# Patient Record
Sex: Male | Born: 1950 | Race: Black or African American | Hispanic: No | Marital: Married | Smoking: Current some day smoker
Health system: Southern US, Community
[De-identification: ages and names within clinical notes are randomized; demographics above are authoritative.]

## PROBLEM LIST (undated history)

## (undated) DIAGNOSIS — T7840XA Allergy, unspecified, initial encounter: Secondary | ICD-10-CM

## (undated) DIAGNOSIS — B009 Herpesviral infection, unspecified: Secondary | ICD-10-CM

## (undated) DIAGNOSIS — I10 Essential (primary) hypertension: Secondary | ICD-10-CM

## (undated) DIAGNOSIS — E785 Hyperlipidemia, unspecified: Secondary | ICD-10-CM

## (undated) HISTORY — DX: Hyperlipidemia, unspecified: E78.5

## (undated) HISTORY — DX: Allergy, unspecified, initial encounter: T78.40XA

## (undated) HISTORY — DX: Essential (primary) hypertension: I10

## (undated) HISTORY — PX: APPENDECTOMY: SHX54

## (undated) HISTORY — DX: Herpesviral infection, unspecified: B00.9

---

## 2001-12-18 ENCOUNTER — Ambulatory Visit (HOSPITAL_COMMUNITY): Admission: RE | Admit: 2001-12-18 | Discharge: 2001-12-18 | Payer: Self-pay | Admitting: Cardiology

## 2001-12-18 ENCOUNTER — Encounter: Payer: Self-pay | Admitting: Cardiology

## 2005-12-28 ENCOUNTER — Emergency Department (HOSPITAL_COMMUNITY): Admission: EM | Admit: 2005-12-28 | Discharge: 2005-12-28 | Payer: Self-pay | Admitting: Emergency Medicine

## 2006-01-04 ENCOUNTER — Ambulatory Visit: Payer: Self-pay | Admitting: Internal Medicine

## 2006-01-04 ENCOUNTER — Inpatient Hospital Stay (HOSPITAL_COMMUNITY): Admission: EM | Admit: 2006-01-04 | Discharge: 2006-01-14 | Payer: Self-pay | Admitting: Emergency Medicine

## 2006-01-05 ENCOUNTER — Encounter (INDEPENDENT_AMBULATORY_CARE_PROVIDER_SITE_OTHER): Payer: Self-pay | Admitting: Specialist

## 2006-01-09 ENCOUNTER — Encounter (INDEPENDENT_AMBULATORY_CARE_PROVIDER_SITE_OTHER): Payer: Self-pay | Admitting: *Deleted

## 2006-01-09 ENCOUNTER — Encounter (INDEPENDENT_AMBULATORY_CARE_PROVIDER_SITE_OTHER): Payer: Self-pay | Admitting: Specialist

## 2006-01-30 ENCOUNTER — Encounter
Admission: RE | Admit: 2006-01-30 | Discharge: 2006-01-30 | Payer: Self-pay | Admitting: Thoracic Surgery (Cardiothoracic Vascular Surgery)

## 2006-02-04 ENCOUNTER — Ambulatory Visit: Payer: Self-pay | Admitting: Hospitalist

## 2006-02-04 ENCOUNTER — Encounter (INDEPENDENT_AMBULATORY_CARE_PROVIDER_SITE_OTHER): Payer: Self-pay | Admitting: Internal Medicine

## 2006-02-04 DIAGNOSIS — N189 Chronic kidney disease, unspecified: Secondary | ICD-10-CM

## 2006-02-04 DIAGNOSIS — Z8709 Personal history of other diseases of the respiratory system: Secondary | ICD-10-CM | POA: Insufficient documentation

## 2006-02-04 DIAGNOSIS — I498 Other specified cardiac arrhythmias: Secondary | ICD-10-CM

## 2006-02-04 DIAGNOSIS — F528 Other sexual dysfunction not due to a substance or known physiological condition: Secondary | ICD-10-CM

## 2006-02-04 DIAGNOSIS — F17200 Nicotine dependence, unspecified, uncomplicated: Secondary | ICD-10-CM

## 2006-02-04 DIAGNOSIS — I10 Essential (primary) hypertension: Secondary | ICD-10-CM | POA: Insufficient documentation

## 2006-02-04 DIAGNOSIS — J984 Other disorders of lung: Secondary | ICD-10-CM

## 2006-02-04 DIAGNOSIS — D649 Anemia, unspecified: Secondary | ICD-10-CM

## 2006-02-04 DIAGNOSIS — E1169 Type 2 diabetes mellitus with other specified complication: Secondary | ICD-10-CM

## 2006-02-04 DIAGNOSIS — E785 Hyperlipidemia, unspecified: Secondary | ICD-10-CM

## 2006-02-04 DIAGNOSIS — J869 Pyothorax without fistula: Secondary | ICD-10-CM | POA: Insufficient documentation

## 2006-02-04 LAB — CONVERTED CEMR LAB
BUN: 12 mg/dL (ref 6–23)
CO2: 29 meq/L (ref 19–32)
Calcium: 9.5 mg/dL (ref 8.4–10.5)
Glucose, Bld: 98 mg/dL (ref 70–99)
Platelets: 327 10*3/uL (ref 150–400)
RBC: 4.29 M/uL (ref 4.22–5.81)
WBC: 7.1 10*3/uL (ref 4.0–10.5)

## 2006-02-12 ENCOUNTER — Ambulatory Visit: Payer: Self-pay | Admitting: Internal Medicine

## 2008-03-24 DIAGNOSIS — K921 Melena: Secondary | ICD-10-CM

## 2008-03-25 ENCOUNTER — Ambulatory Visit: Payer: Self-pay | Admitting: Gastroenterology

## 2008-07-08 IMAGING — CR DG CHEST 2V
2 series · 2 of 2 positions shown · non-contrast
Comparison: 01/05/06.

CLINICAL DATA: Lung mass.  Assess for improvement of left pleural effusion. 
 CHEST ? 2 VIEW:

[view not recorded (1 of 2)]
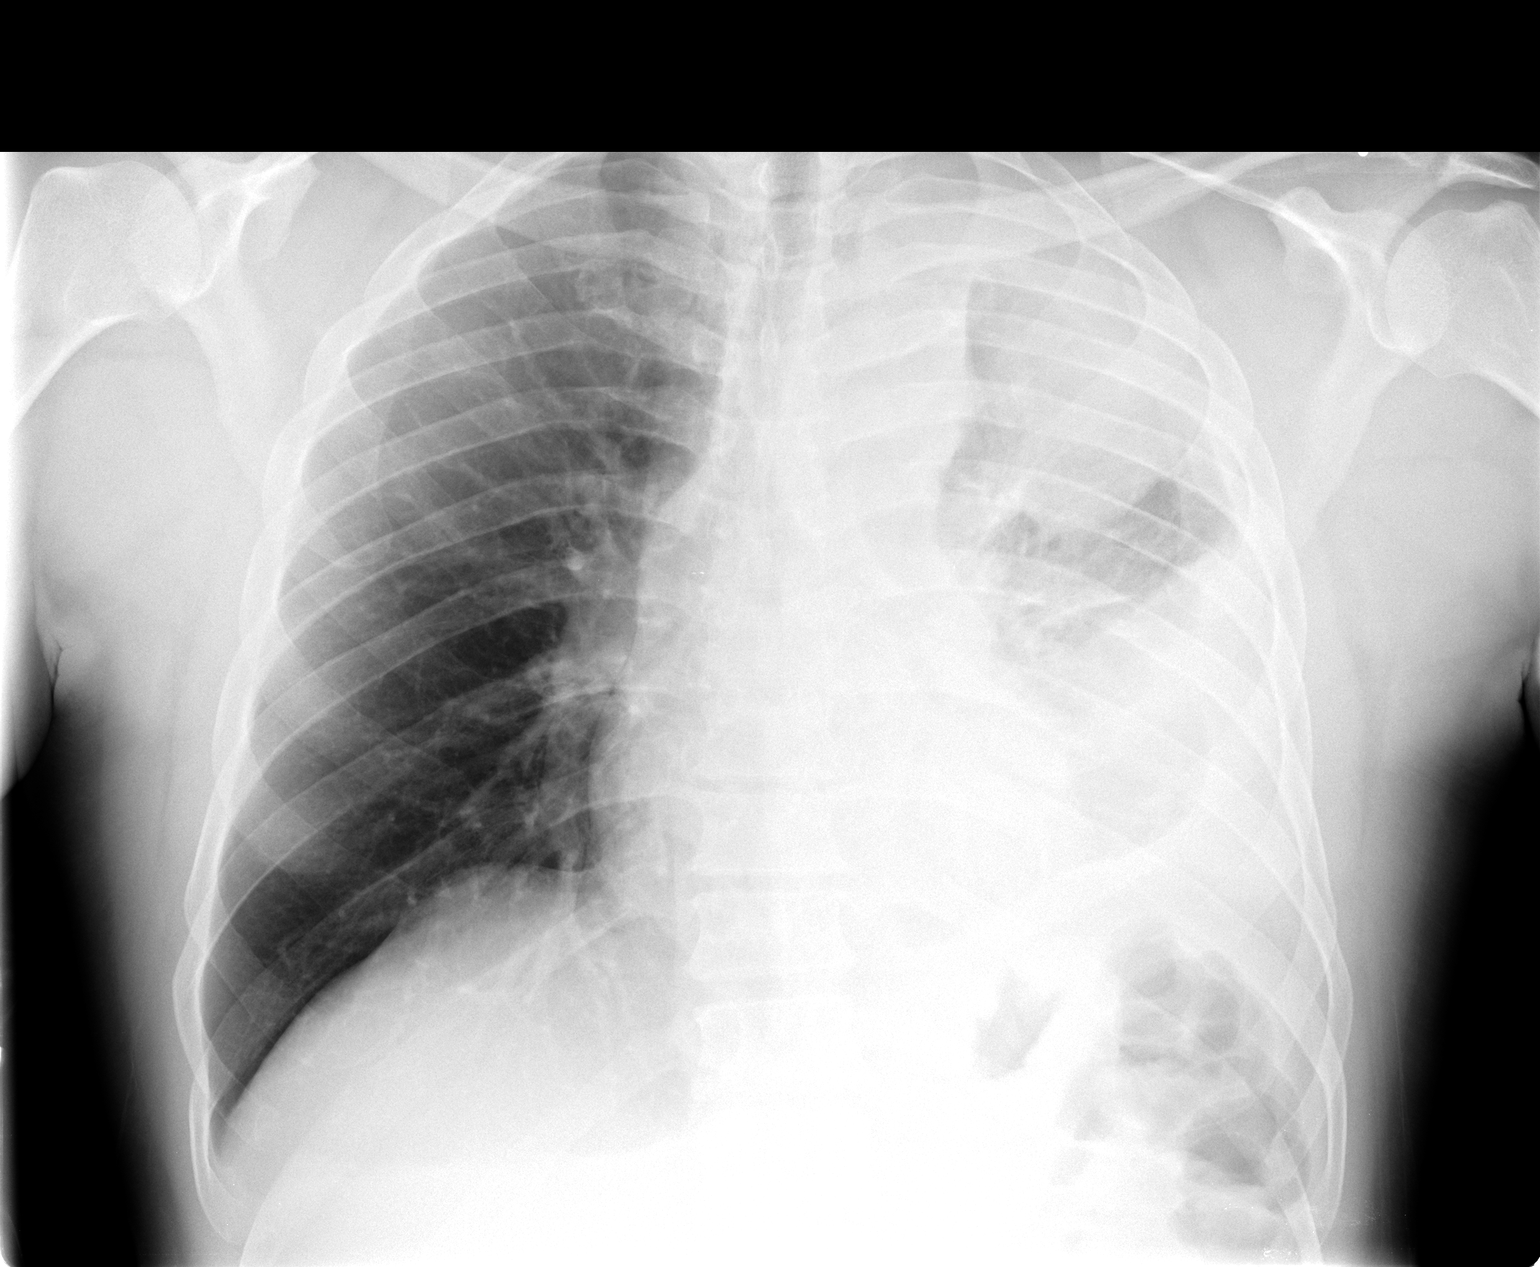

[view not recorded (2 of 2)]
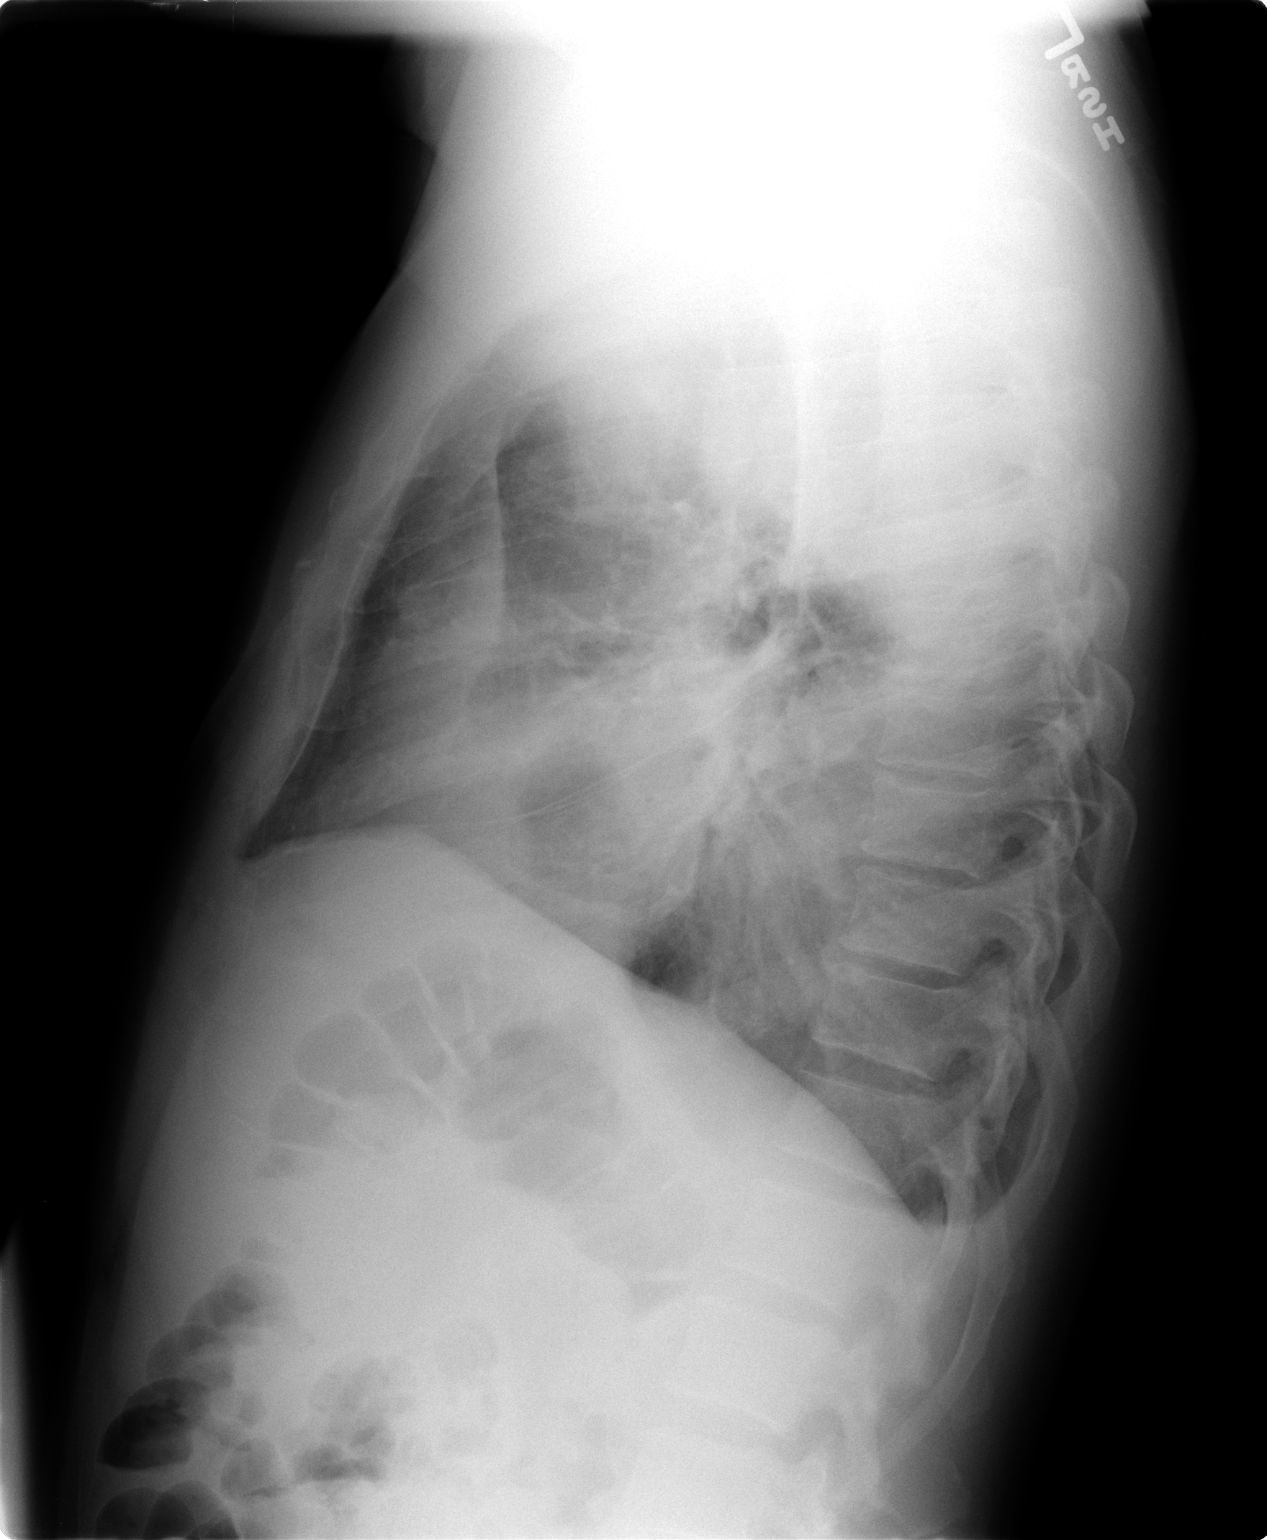

[2 of 2 positions shown; findings below may reference images not displayed]

FINDINGS: Large loculated left pleural effusion has not changed in the interval.  Right lung clear.
IMPRESSION: 1.  No change in large loculated left pleural effusion, worrisome for empyema.  Underlying mass not excluded. 
 2.  Question developing small right pleural effusion.

## 2008-07-09 IMAGING — CR DG CHEST 2V
2 series · 2 of 2 positions shown · non-contrast
Comparison: 01/07/06 radiographs and 01/04/06 chest CT.

CLINICAL DATA: Preop respiratory exam.  Left lung mass ? pneumonia. 
CHEST ? 2 VIEW:

[w chest pa]
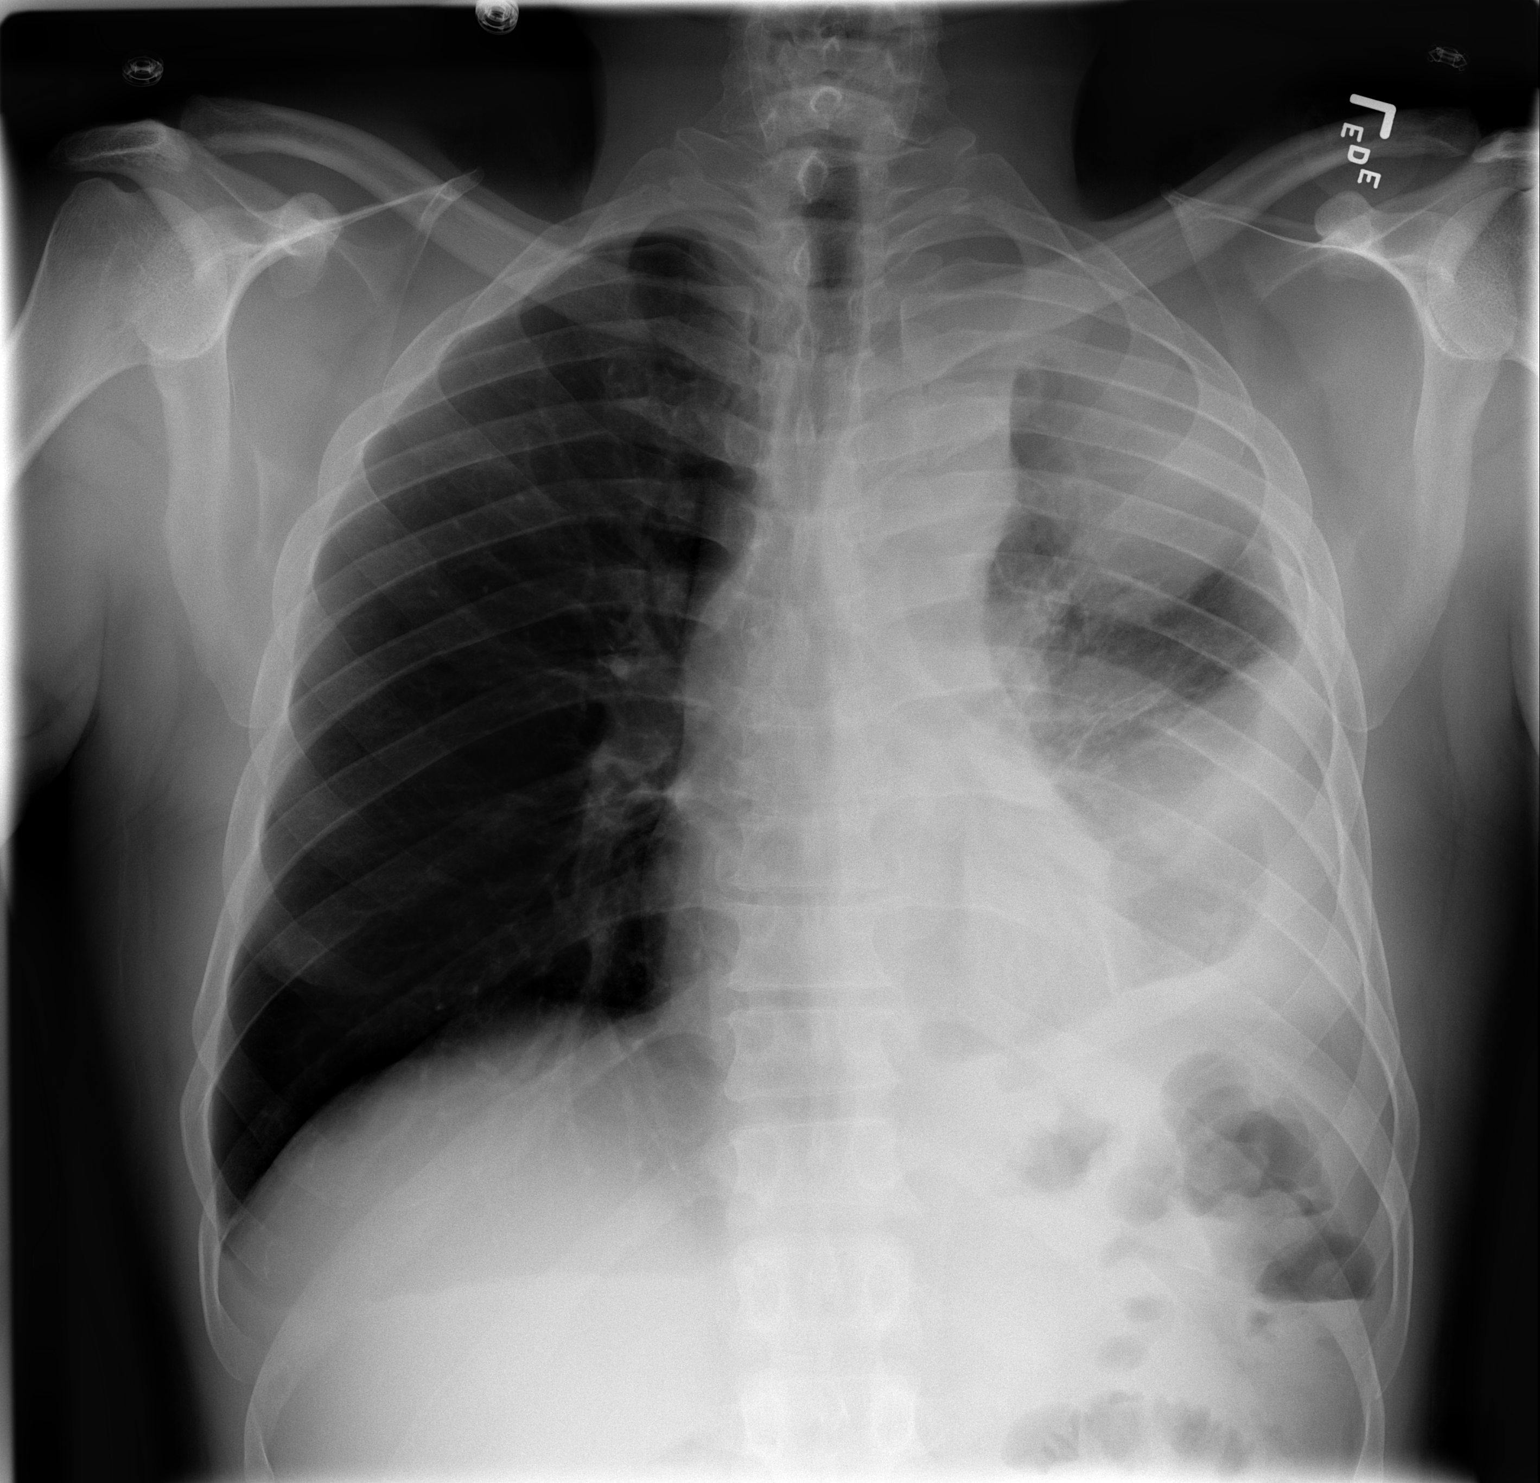

[w chest lat]
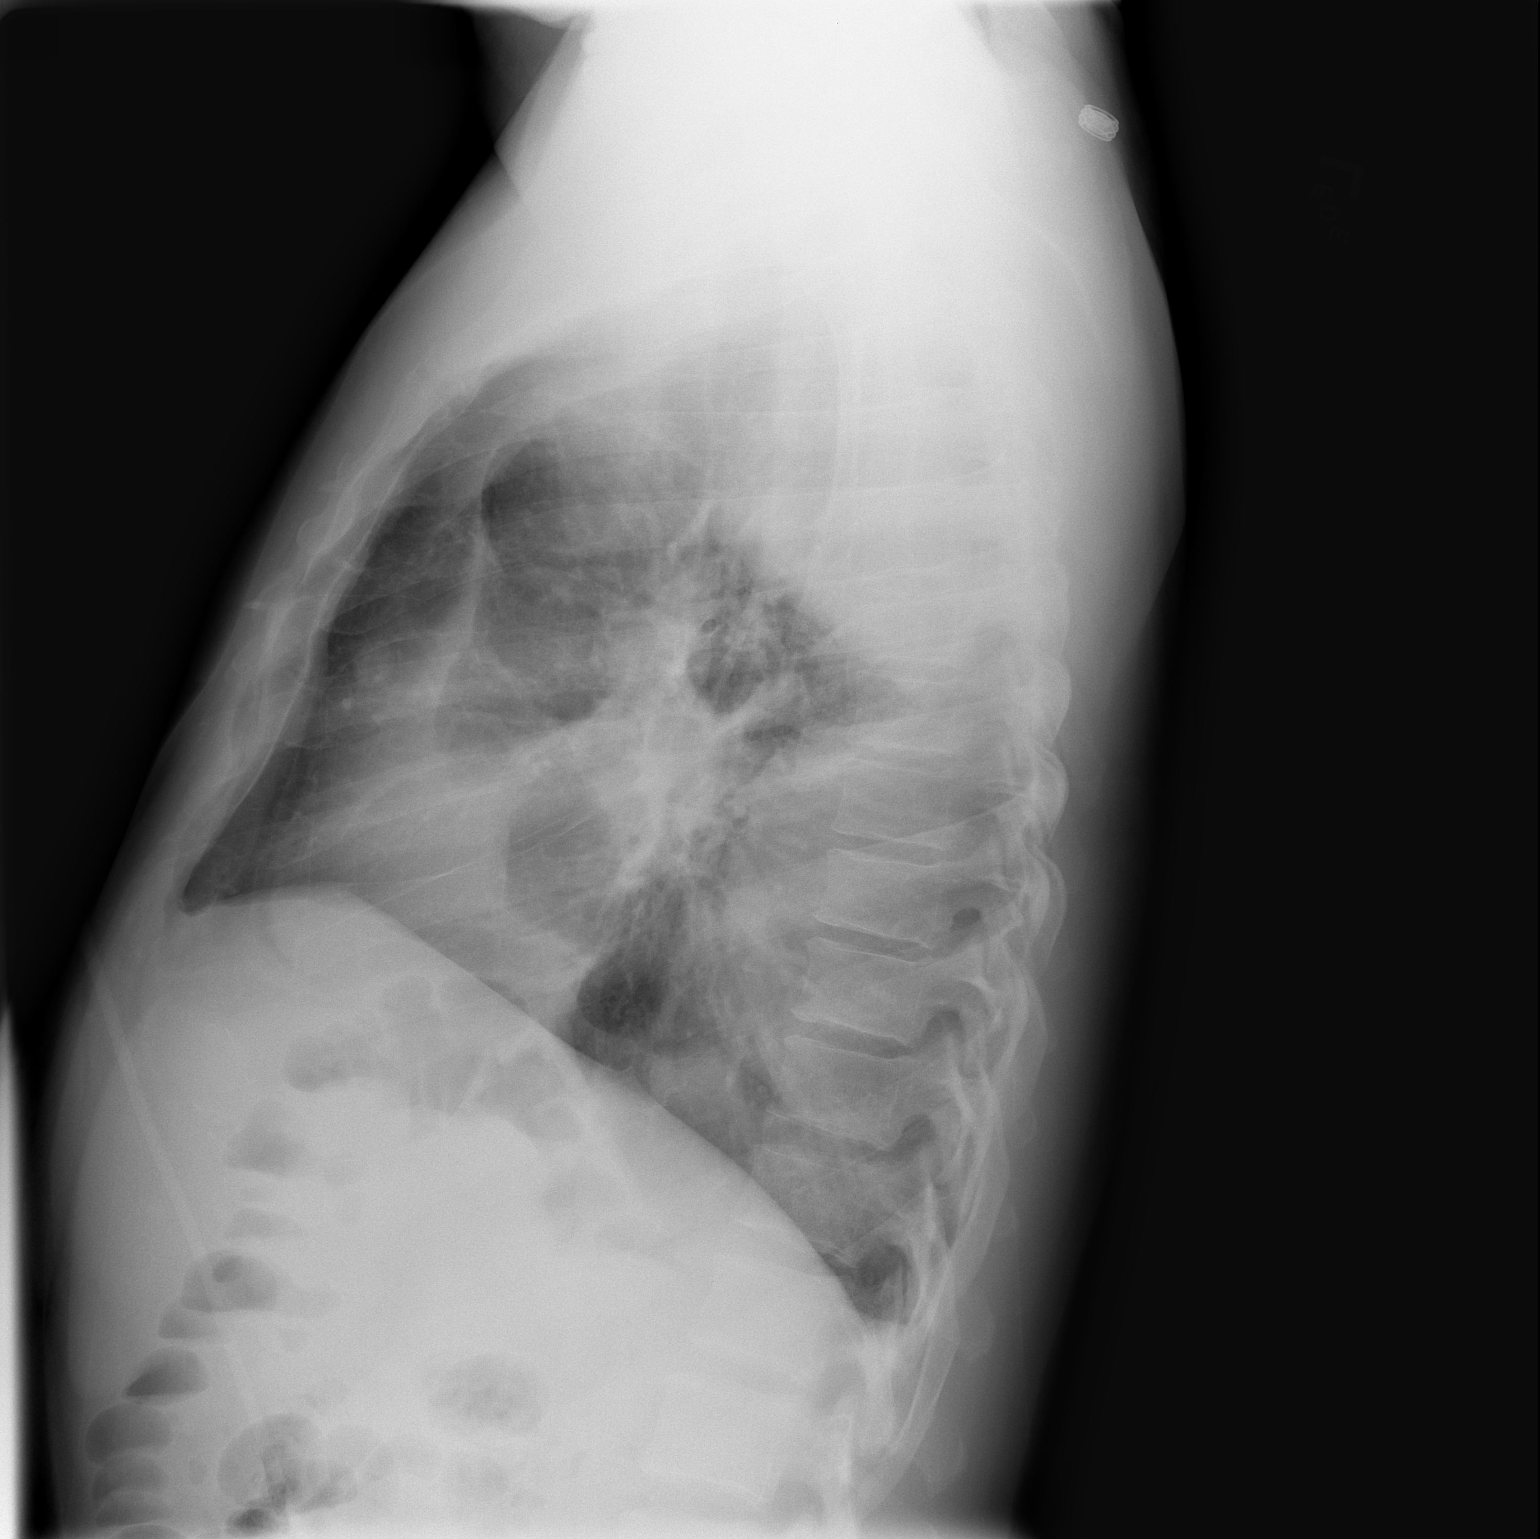

[2 of 2 positions shown; findings below may reference images not displayed]

FINDINGS: The loculated left pleural effusion and associated volume loss in the left hemithorax do not appear significantly changed.  The left lung aeration does appear mildly improved.  The right lung remains clear, and there is no right-sided pleural effusion.
IMPRESSION: Slight interval improvement in left lung aeration.  The loculated left pleural effusion does not appear significantly changed with a large superior component.

## 2008-07-10 IMAGING — CR DG CHEST 1V PORT
1 series · 1 of 1 positions shown · non-contrast
Comparison: 01/08/06.

CLINICAL DATA: 55 year-old male, status post left thoracotomy and chest tube insertions.
PORTABLE CHEST - 1 VIEW:

[view not recorded]
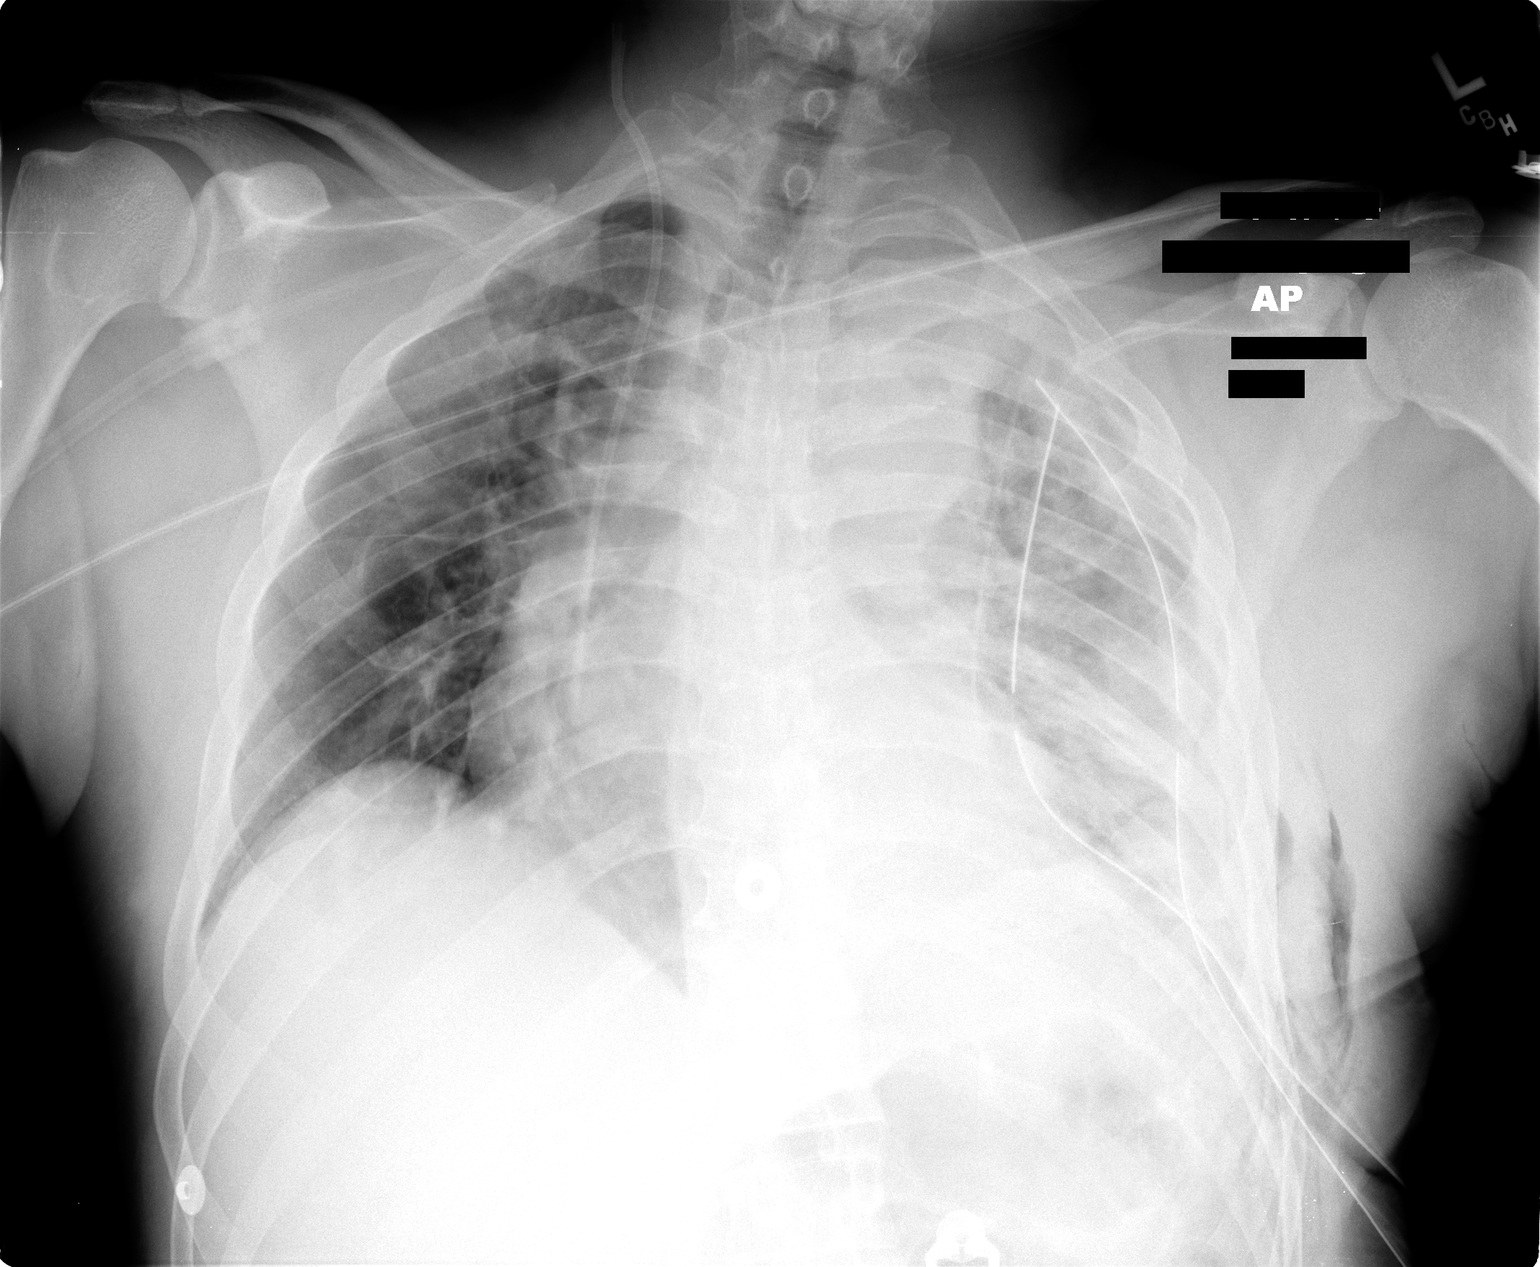

[1 of 1 positions shown; findings below may reference images not displayed]

FINDINGS: Two left chest tubes are noted. Subcutaneous air is present along the left chest wall.  Right IJ central line tip is in the proximal right atrium. There is residual loculated left pleural fluid particularly in the left lung apex.  Diffuse left lung airspace disease and atelectasis persists.  Right lung demonstrates lower volume with increased atelectasis.
IMPRESSION: 1.  Status post left thoracotomy and 2 chest tubes.
2.  Improvement in loculated left effusion with residual left pleural fluid and left lung airspace disease and atelectasis.
3.  Increased right lung diffuse atelectasis with lower lung volumes.

## 2009-01-08 HISTORY — PX: LUNG SURGERY: SHX703

## 2010-05-05 ENCOUNTER — Other Ambulatory Visit: Payer: Self-pay | Admitting: Internal Medicine

## 2010-05-05 DIAGNOSIS — R7989 Other specified abnormal findings of blood chemistry: Secondary | ICD-10-CM

## 2010-05-26 NOTE — Consult Note (Signed)
NAMEMURAD, STAPLES               ACCOUNT NO.:  [id number]   MEDICAL RECORD NO.:  [id number]          PATIENT TYPE:  INP   LOCATION:  5020                         FACILITY:  [facility]   PHYSICIAN:  Salvatore Decent. Dorris Fetch, M.D.DATE OF BIRTH:  [birth year]   DATE OF CONSULTATION:  01/07/2006  DATE OF DISCHARGE:                                 CONSULTATION   DATE OF CONSULTATION:  January 07, 2006.   REASON FOR CONSULTATION:  Left parapneumonic effusion.   HISTORY OF PRESENT ILLNESS:  Mr. Ostrow is a 60 year old gentleman with  history of tobacco abuse and hypertension who initially became ill in  late December.  He had fever, cough, mild shortness of breath, and  pleuritic chest pain.  He was seen in urgent care on December 28, 2005,  and had a diagnosis made of pneumonia.  He was treated with PO  antibiotics and brought back for a followup on January 04, 2006.  He  noted that after being treated with antibiotics he had improvement in  his symptoms, his breathing improved, his fevers had resolved, and his  pain had decreased.  On his follow-up chest x-ray on January 04, 2006,  he was found to have a near-complete white-out of his left lung with a  large parapneumonic effusion.  A CT scan showed a complex loculated  fluid collection.  He underwent an ultrasound-guided thoracentesis where  only 25 mL of fluid was evacuated.  There was no change in the  appearance of his chest x-ray, obviously, after that procedure.  His  effusion has persisted.  He has remained afebrile and overall feels well  but has had persistent leukocytosis.  Studies of the fluids revealed an  LDH of 5556 and a white count of 11,000 which was 98% polys.   PAST MEDICAL HISTORY:  His past medical history is significant for  hypertension, dyslipidemia, recently diagnosed with renal insufficiency  with a creatine of 2 on December 28, 2005, recent pneumonia, remote  appendectomy.   MEDICATIONS PRIOR TO ADMISSION:   Hydrochlorothiazide and Caduet.   ALLERGIES:  He has no known drug allergies.   SOCIAL HISTORY:  He is divorced.  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  He  lives by himself.  He smoked about a half a pack a day for four years.   FAMILY HISTORY:  Noncontributory.   REVIEW OF SYSTEMS:  Really he has felt well prior to this acute illness.  With the acute illness he had fatigue, night sweats, chest pain, and  anorexia.  All of those symptoms has improved since initially being  treated with antibiotics.  All other systems are negative.   PHYSICAL EXAMINATION:  GENERAL:  Mr. Nguyen is a well-appearing 50-year-  old white male in no acute distress.  He is a well developed, well  nourished, and in no acute distress.  VITAL SIGNS:  His blood pressure is 144/89, pulse 95, respirations are  18.  His oxygen saturation is 95% on room air.  He is afebrile.  NEUROLOGIC:  His alert and oriented times three.  He is appropriate and  grossly intact.  HEENT:  Unremarkable.  NECK:  Supple without thyromegaly, adenopathy, or bruits.  CARDIAC:  Regular rate and rhythm.  Normal S1 and S2.  No rubs or  murmurs.  LUNGS:  Have markedly decreased breath sounds over the lower two-thirds  of the left chest.  Right lung is clear with no wheezing.  ABDOMEN:  Soft and nontender.  EXTREMITIES:  Without clubbing, cyanosis, or edema.  He has 2+ pulses  throughout.   LABORATORY DATA:  His white count today is 18.1, hematocrit is 33,  platelets 660.  Sodium 139, potassium 4.1, BUN 8, creatinine 1.6,  glucose is 79.  Pleural fluid as previously described.  Cultures with no  growth today.  His PT was 16.8 with an INR of 1.2.  Sedimentation rate  was 122.  Hepatitis panel is pending.  AST was 58, ALT was 135, alkaline  phosphatase was 154, bilirubin was normal.  His cholesterol was 106 with  a low HDL at 14.   IMPRESSION:  Mr. Piscopo is a 60 year old gentleman with a recent  pneumonia who has a large left empyema.  From CT  appearance this is  loculated with a thick layer of exudate on the visceral and parietal  pleural surface and is unlikely to resolve with chest tube or pigtail-  type drainage.  He needs left video-assisted thoracoscopic surgery for  drainage and decortication.  He also was noted to have a small nodule in  the right lung.   I have recommended to the patient that he undergo left VATS of empyema  and decortication.  There is a possibility we might have to do a small  thoracotomy depending on the degree of inflammation and the degree of  entrapment of the lung.  I advised him of the natural history of empyema  as well as the indications, risks, benefits, and alternatives per this  treatment.  He understands that the risks include, but are not limited  to, death, myocardial infarction, deep venous thrombosis, pulmonary  embolus, bleeding, possible need for transfusions, infections, as well  as organ/system dysfunction including respiratory/renal complications  and prolonged air leakage.  He understands and accepts these risks and  agrees to proceed.  We will plan for surgery on Wednesday afternoon,  which is the first available time.           ______________________________  Salvatore Decent Dorris Fetch, M.D.     SCH/MEDQ  D:  01/07/2006  T:  01/08/2006  Job:  [id number]   cc:   Manning Charity, MD

## 2010-05-26 NOTE — Discharge Summary (Signed)
Tyler Klein, Tyler Klein               ACCOUNT NO.:  [id number]   MEDICAL RECORD NO.:  [id number]          PATIENT TYPE:  INP   LOCATION:  3309                         FACILITY:  [facility]   PHYSICIAN:  Tyler Klein, M.D.    DATE OF BIRTH:  January 30, 1950   DATE OF ADMISSION:  01/04/2006  DATE OF DISCHARGE:  01/14/2006                               DISCHARGE SUMMARY   DISCHARGE DIAGNOSIS:  Left empyema, that is complication of community  acquired pneumonia, status post a video-assisted thoracoscopic surgery  on January 09, 2006.   ADDITIONAL DIAGNOSES:  Include:  1. Renal insufficiency with a question of it being chronic versus      acute.  2. Hyperlipidemia.  3. Hypertension.  4. Mild normocytic anemia.  5. Transaminitis.  6. Finding of a nodule along the minor fissure seen on chest x-ray.   DISCHARGE MEDICATIONS:  1. Augmentin 875 mg p.o. once a day for 9 days to conclude a 14 day      course.  2. Norvasc 10 mg by mouth once a day.  3. Atenolol 100 mg by mouth once a day.   DISPOSITION AT DISCHARGE:  Was improved with resolution of his empyema,  but residual pain in the left side of chest on inspiration, which was to  be expected.  He has follow up with [facility], they are going to make the  appointment for him, at which time it will be assessed whether he has  had any reaccumulation of his empyema based on an imaging to be  determined by [facility].  He is to follow up with Dr. Valetta Klein at the  [facility] in February 04, 2006 at 2:30 p.m., at which  time, it will be addressed whether he has had any return of his symptoms  regarding his pneumonia and complicated effusion.  We will also check  his blood pressure to see if it has been controlled on his changed  medication regimen.  We will obtain a comprehensive metabolic panel in  regards to his transaminitis, which was thought to possibly be secondary  to his use of statins and we will also check a CBC to determine if  his  mild anemia is chronic and needs to be worked up in more detail.   PROCEDURES PERFORMED DURING THIS HOSPITALIZATION:  Included a VATS as  previously stated, during which time, he was found to have a large  empyema.  He had an ultrasound-guided thoracentesis that was performed  on January 05, 2006, which showed a fluid very consistent with exudate  and infection.   CONSULTATIONS:  Were obtained by [facility], Dr. Dorris Klein, as stated  previously.   BRIEF ADMITTING HISTORY AND PHYSICAL:  Tyler Klein is a 60 year old male  who presented to the emergency department for followup of a pneumonia  that had been treated in his left lower lung on December 21 and at his  followup where he had only very mild shortness of breath.  Chest x-ray  showed that he had a complete white out of his left lung.  At this  point, he denied cough,  sputum or chills, though he was having night  sweats and anorexia for 2 weeks.  Denied history of pneumonia.   PHYSICAL EXAMINATION:  VITAL SIGNS:  Temperature 99.  Blood pressure  118/73.  Pulse 90.  Respiratory rate 20.  O2 saturation 96% on room air.  GENERAL:  He was in no apparent distress.  LUNGS:  Decreased air movement in the left lung field with crackles in  the base of the left lung.  The left lung was also dull to percussion.  CARDIOVASCULAR EXAM:  Normal.  He had some tenderness to palpation in  the right upper quadrant and voluntary guarding.  No rebound or  tenderness.  Normal bowel sounds.  No  lymphadenopathy.  NEURO:  He was A&O x3 with an appropriate psych and a nonfocal neuro  exam.   White blood cell count 17.1, hemoglobin 12.2, platelets 590, ANC 12.7,  MCV 82.3.  Sodium 133, potassium 4.2, chloride 101, BUN 17, creatinine  1.8, this down from 2.0 on December 21, his glucose was 98, bicarb 25.  Chest x-ray showed progression of disease with new complete  opacification of the left lung, probably related to pleural fluid and  consolidation, and  empyema could not be excluded.  D-dimer was 1.75.  For a more detailed history and physical, please refer to the chest.   HOSPITAL COURSE:  1. His left empyema.  With his elevated white count and the chest x-      ray findings of a new complete opacification of the left lung, it      was decided to perform an ultrasound-guided thoracentesis, which      showed his fluid consistent with exudate.  He was started on      vancomycin and Zosyn for a broad bacterial coverage.  A CT scan was      also worrisome for empyema, in addition to a nodule along the minor      fissure, which was suggested we followup in 3 months.  With the      initiation of antibiotics, his clinical picture did not change      dramatically.  His white blood cell count remained elevated and      though he did not have high fevers, he continued to have episodic      low-grade temps, especially over night, though symptoms of      shortness of breath never came on.  However, since on repeated      chest x-ray, it was clear that his empyema was not versus at that      time complicated, parapneumonic effusion was not improving, it was      decided that he needed to have a VATS procedure performed.  [facility]      was consulted and they agreed.  Said procedure was performed by Dr.      Dorris Klein with no complications and with a placement of multiple      chest tubes, again Tyler Klein tolerated the procedure well.      Afterwards, his white blood cell count decided to drop.  He was      switched to p.o. Augmentin for a prolonged course.  He was to      followup on discharge and with his chest x-rays continuing to show      that his lung remained expanded and with this being asymptomatic,      his chest tube were removed when they stopped draining and though  he was continuing to have some chest pain, this was to be expected     following his VATS procedure and the extent of empyema that he had,      so he was given followup as  stated previously.  2. His questionable chronic renal insufficiency.  Tyler Klein did not      have any prior records before his ER visit where he was found to      have a creatinine of 2.0.  During the course of his      hospitalization, his creatinine did slowly trend down from 1.8 to      1.3 at time of discharge.  Again, not knowing his baseline, we      cannot know if this is chronic or acute.  There seem to be some      that may have been acute.  His urine output never declined and he      never showed any other evidence of renal insufficiency, except for      his elevated creatinine so this is something that can be follow up      with as an outpatient.  3. His transaminitis.  The only potential explanation being the use of      his statin, though he had been on a statin for quite some time,      though he did take it off and on.  A hepatitis workup was obtained,      which was negative.  He did not show any other evidence of liver      disease, did not have risk factors, was not a drinker, was not      obese, did not have any issues with gallstones, but his brief right      upper quadrant pain resolving relatively quickly.  Denied pain like      that previously and it did not seem to be an issue during the rest      of his course and was not worked up specifically.  CT scan did not      show anything, the ultrasound would be the best bet; however, no      ultrasound was performed specifically for this problem, especially      with the issue at hand being his empyema and the fact that his      symptoms went away rather quickly and his LFTs were trending down.      Statin was stopped as the thoughts of this was causing his elevated      LFTs and we did not have a baseline and a fasting lipid panel was      checked and was within normal limits.  However, we decided to keep      him off the statin and to follow up with him as an outpatient.  4. Was his hypertension.  We held his  hydrochlorothiazide that he had      been taking previously because of his renal insufficiency.  We      thought it was possible that his hydrochlorothiazide might be a      cause.  His blood pressure stayed controlled without any medication      for some time, but when it began to increase again, he was started      on Norvasc because he had been taking Caduet previously, which was      stopped because we did not want him on any statins.  His blood  pressure still remained elevated, we decided to add atenolol to his      regimen and we kept him on atenolol and Norvasc, both being      inexpensive medications.  Should his renal function stay in a      normal range in the future, we can consider restarting      hydrochlorothiazide, though we did not restart it during his      hospitalization.  5. Anemia.  He did have moderate anemia when he came in that was     normocytic, ranging from 12.12 to as low as 9.3.  However, again he      was normocytic, did not show any evidence of active bleed and we      decided that this was something that could be pursued as an      outpatient whether his hemoglobin was low when he was not ill and      if we could commit him to obtain colonoscopy in the near future to      rule out a GI source of cancer.   On discharge, his white blood cell count was 10.8, hemoglobin 9.3,  hematocrit 27.3, platelet count was 624.  Sodium 136, potassium 3.6,  chloride 100, bicarb 27, glucose 88, BUN 13, creatinine 1.31, calcium  9.2.   Vital signs, his temperature was 99.3, blood pressure 133/77, heart rate  83-106, respiratory rate 17, O2 saturation 100% on room air.   LABS PENDING AT DISCHARGE:  An acid fast culture of his left empyema,  which came back negative.      Tyler Klein, M.D.  Electronically Signed      Tyler Klein, M.D.  Electronically Signed    JC/MEDQ  D:  02/28/2006  T:  02/28/2006  Job:  [id number]   cc:   Tyler Klein, M.D.

## 2010-05-26 NOTE — Op Note (Signed)
Tyler Klein, URBANEK               ACCOUNT NO.:  [id number]   MEDICAL RECORD NO.:  [id number]          PATIENT TYPE:  INP   LOCATION:  3309                         FACILITY:  [facility]   PHYSICIAN:  Salvatore Decent. Dorris Fetch, M.D.DATE OF BIRTH:  1950-11-07   DATE OF PROCEDURE:  01/09/2006  DATE OF DISCHARGE:                               OPERATIVE REPORT   PREOPERATIVE DIAGNOSES:  Left empyema.   POSTOPERATIVE DIAGNOSIS:  Organized empyema.   PROCEDURE:  Left video assisted thoracoscopy and decortication.   SURGEON:  Salvatore Decent. Dorris Fetch, M.D.   ASSISTANT:  Rowe Clack, P.A.-C.   ANESTHESIA:  General.   FINDINGS:  Severe organized empyema with extensive fibrinous exudate  throughout the chest cavity. Very friable tissues. More extensive  involvement of the parietal than the visceral pleura.   CLINICAL NOTE:  Tyler Klein is a 60 year old gentleman recently treated  for pneumonia.  He presented for routine follow-up after one week of  treatment and was found to have a large left pleural effusion.  A CT  scan showed a complex loculated empyema.  Needle aspiration was done  which showed a high elevated LDH and white blood cell count in the  fluid. The patient was advised to undergo left VATS for decortication  and drainage of the empyema.  He was advised that a thoracotomy might be  necessary depending on the degree of organization and, particularly, the  degree of adhesions on the visceral pleura.  The patient understood and  accepted the risks of surgery and agreed to proceed.   OPERATIVE NOTE:  Tyler Klein was brought to the preop holding area on  January 09, 2006.  He was already on intravenous antibiotics. PAS hose  were placed for DVT prophylaxis. Arterial blood pressure monitoring line  was placed. A central venous catheter was placed for IV access. The  patient was taken to the operating room, anesthetized and intubated with  a double lumen tracheal tube.  A Foley catheter was  placed. The patient  was placed in a right lateral decubitus position and the left chest was  prepped and draped in the usual fashion.   A transverse incision was made in the seventh intercostal space in the  midaxillary line and was carried down through the skin and subcutaneous  tissue.  The chest was entered bluntly using a hemostat.  There was no  free space at this site.  Therefore, a second incision was made more  posteriorly below the tip of the scapula where there was a larger  portion of the fluid collection. On entering the chest here, there was  noted to be a very thickened parietal pleura and upon entering the  chest, several hundred mL of murky bloody fluid was evacuated.  This was  sent for both culture and cytology.  The tissue was subsequently sent  for both pathology and culture, as well.  The scope was inserted and  free space was dissected out to communicate with the other port incision  and the scope was moved back to the more anterior location.   There was extensive fibrinous exudate  within the chest cavity and  significant thickening of the parietal pleura which required dissection  and decortication.  There also was a more moderate amount of exudate on  the visceral pleural surface.  This also was cleaned off using blunt  dissection. Adhesions were taken down to allow complete evacuation of  all the loculated areas.  The tissue was very friable and bled freely.  There was very dense consolidation of the lung in the base of the left  lower lobe.  There, also, was more extensive disease on the visceral  pleural surface of this area.  The chest was copiously irrigated with  warm saline multiple times throughout the procedure.  A third incision  was made more anteriorly.  36-French chest tubes were placed through the  anterior two incisions, a right angle tube was placed posteriorly and a  straight tube was placed anterolaterally. The tubes were placed to  suction.  The remaining incision was closed in standard fashion.  The  lung was reinflated.  The patient was placed in a supine position, was  extubated in the operating room, and taken to the post anesthesia care  unit in good condition.           ______________________________  Salvatore Decent Dorris Fetch, M.D.     SCH/MEDQ  D:  01/09/2006  T:  01/09/2006  Job:  [id number]

## 2011-02-05 ENCOUNTER — Ambulatory Visit (INDEPENDENT_AMBULATORY_CARE_PROVIDER_SITE_OTHER): Payer: BC Managed Care – PPO

## 2011-02-05 DIAGNOSIS — H1030 Unspecified acute conjunctivitis, unspecified eye: Secondary | ICD-10-CM

## 2011-05-16 ENCOUNTER — Telehealth: Payer: Self-pay

## 2011-05-16 NOTE — Telephone Encounter (Signed)
Pt last seen ago and came to office we were closed and he is leaving to go out of town in the am, he was coming in to get a rx refill on his blood pressure medication, he would like to know if he can get a refill on atleast 5 pills to get him thru till he can come in, he gets off at 7pm and its been difficult.

## 2011-05-17 NOTE — Telephone Encounter (Signed)
Can we do this?  His paper chart is at the nurses station.

## 2011-05-21 ENCOUNTER — Other Ambulatory Visit: Payer: Self-pay | Admitting: *Deleted

## 2011-05-21 ENCOUNTER — Ambulatory Visit: Payer: BC Managed Care – PPO

## 2011-05-21 MED ORDER — LISINOPRIL-HYDROCHLOROTHIAZIDE 20-12.5 MG PO TABS: 1.0000 | ORAL_TABLET | Freq: Every day | ORAL | Status: DC

## 2011-05-21 MED ORDER — AMLODIPINE BESYLATE 10 MG PO TABS: 10.0000 mg | ORAL_TABLET | Freq: Every day | ORAL | Status: DC

## 2011-06-12 ENCOUNTER — Ambulatory Visit: Payer: BC Managed Care – PPO

## 2011-06-24 ENCOUNTER — Ambulatory Visit (INDEPENDENT_AMBULATORY_CARE_PROVIDER_SITE_OTHER): Payer: BC Managed Care – PPO | Admitting: Internal Medicine

## 2011-06-24 VITALS — BP 134/81 | HR 75 | Temp 98.1°F | Resp 16 | Ht 67.0 in | Wt 192.6 lb

## 2011-06-24 DIAGNOSIS — E785 Hyperlipidemia, unspecified: Secondary | ICD-10-CM

## 2011-06-24 DIAGNOSIS — I10 Essential (primary) hypertension: Secondary | ICD-10-CM

## 2011-06-24 DIAGNOSIS — D649 Anemia, unspecified: Secondary | ICD-10-CM

## 2011-06-24 DIAGNOSIS — F17200 Nicotine dependence, unspecified, uncomplicated: Secondary | ICD-10-CM

## 2011-06-24 DIAGNOSIS — J309 Allergic rhinitis, unspecified: Secondary | ICD-10-CM

## 2011-06-24 DIAGNOSIS — N189 Chronic kidney disease, unspecified: Secondary | ICD-10-CM

## 2011-06-24 DIAGNOSIS — H109 Unspecified conjunctivitis: Secondary | ICD-10-CM

## 2011-06-24 LAB — POCT CBC
Lymph, poc: 2 (ref 0.6–3.4)
MCHC: 33.3 g/dL (ref 31.8–35.4)
MID (cbc): 0.7 (ref 0–0.9)
MPV: 9.5 fL (ref 0–99.8)
POC LYMPH PERCENT: 28.7 %L (ref 10–50)
POC MID %: 9.8 %M (ref 0–12)
Platelet Count, POC: 299 10*3/uL (ref 142–424)
RDW, POC: 15.9 %
WBC: 7.1 10*3/uL (ref 4.6–10.2)

## 2011-06-24 MED ORDER — LORATADINE 10 MG PO TABS: 10.0000 mg | ORAL_TABLET | Freq: Every day | ORAL | Status: DC

## 2011-06-24 MED ORDER — TOBRAMYCIN 0.3 % OP SOLN: 1.0000 [drp] | OPHTHALMIC | Status: AC

## 2011-06-24 MED ORDER — LISINOPRIL-HYDROCHLOROTHIAZIDE 20-12.5 MG PO TABS: 1.0000 | ORAL_TABLET | Freq: Every day | ORAL | Status: DC

## 2011-06-24 MED ORDER — AMLODIPINE BESYLATE 10 MG PO TABS: 10.0000 mg | ORAL_TABLET | Freq: Every day | ORAL | Status: DC

## 2011-06-24 NOTE — Progress Notes (Addendum)
Subjective:    Patient ID: Tyler Klein, male    DOB: 07/08/1950, 61 y.o.   MRN: [id number]  HPI 1. HYPERLIPIDEMIA  Lipid panel  2. ANEMIA-NOS  POCT CBC  3. HYPERTENSION  Comprehensive metabolic panel, lisinopril-hydrochlorothiazide (ZESTORETIC) 20-12.5 MG per tablet, amLODipine (NORVASC) 10 MG tablet  4. RENAL INSUFFICIENCY, CHRONIC  PSA  5. Conjunctivitis    6. AR (allergic rhinitis)  tobramycin (TOBREX) 0.3 % ophthalmic solution, loratadine (CLARITIN) 10 MG tablet   here for routine followup  Also complaining of irritation with discharge in the left eye/has had sinus irritation with sneezing and itching of nose denies for about 3 months/happens every spring Last followup here one year ago He has no new complaints although he notes a 20 pound weight gain from decreased activity  Family history-mother and father still alive in her 80s/some relatives lived into their 83s  Review of Systems  Constitutional: Negative for activity change, appetite change and fatigue.  HENT: Positive for congestion, rhinorrhea, sneezing and postnasal drip. Negative for hearing loss.   Eyes: Positive for discharge, redness and itching. Negative for photophobia and visual disturbance.  Respiratory: Negative for cough, chest tightness, shortness of breath and wheezing.   Cardiovascular: Negative for chest pain, palpitations and leg swelling.  Gastrointestinal: Negative.   Genitourinary: Negative.   Musculoskeletal: Negative.   Skin: Negative.   Neurological: Negative.        Objective:   Physical Exam Vital signs stable except weight 192 Left eye is injected with clear discharge and pus at the inner canthus Rhinitis clear Nares boggy No nodes or thyromegaly Chest clear Heart regular without murmur Extremities with no edema       Results for orders placed in visit on 06/24/11  POCT CBC      Component Value Range   WBC 7.1  4.6 - 10.2 K/uL   Lymph, poc 2.0  0.6 - 3.4   POC LYMPH PERCENT  28.7  10 - 50 %L   MID (cbc) 0.7  0 - 0.9   POC MID % 9.8  0 - 12 %M   POC Granulocyte 4.4  2 - 6.9   Granulocyte percent 61.5  37 - 80 %G   RBC 5.40  4.69 - 6.13 M/uL   Hemoglobin 14.9  14.1 - 18.1 g/dL   HCT, POC 40.9  81.1 - 53.7 %   MCV 82.7  80 - 97 fL   MCH, POC 27.6  27 - 31.2 pg   MCHC 33.3  31.8 - 35.4 g/dL   RDW, POC 91.4     Platelet Count, POC 299.0  142 - 424 K/uL   MPV 9.5  0 - 99.8 fL   Past anemia no longer present Assessment & Plan:   1. HYPERLIPIDEMIA  Lipid panel  2. ANEMIA-NOS  POCT CBC  3. HYPERTENSION  Comprehensive metabolic panel, lisinopril-hydrochlorothiazide (ZESTORETIC) 20-12.5 MG per tablet, amLODipine (NORVASC) 10 MG tablet  4. RENAL INSUFFICIENCY, CHRONIC  PSA  5. Conjunctivitis    6. AR (allergic rhinitis)  tobramycin (TOBREX) 0.3 % ophthalmic solution, loratadine (CLARITIN) 10 MG tablet   Meds ordered this encounter  Medications  . lisinopril-hydrochlorothiazide (ZESTORETIC) 20-12.5 MG per tablet    Sig: Take 1 tablet by mouth daily.    Dispense:  90 tablet    Refill:  1  . amLODipine (NORVASC) 10 MG tablet    Sig: Take 1 tablet (10 mg total) by mouth daily.    Dispense:  90 tablet  Refill:  1  . tobramycin (TOBREX) 0.3 % ophthalmic solution    Sig: Place 1 drop into the left eye every 4 (four) hours.    Dispense:  5 mL    Refill:  0  . loratadine (CLARITIN) 10 MG tablet    Sig: Take 1 tablet (10 mg total) by mouth daily. For allergies    Dispense:  30 tablet    Refill:  11  He has stopped his cholesterol medicine because it was too expensive during the last year We'll check his routine labs and respond by mail and restart cholesterol rx if indicated Needs 20 pound weight loss through exercise   Addendum 06/28/2011 Lipid profile shows need for Lipitor 20 mg #90 no refills and repeat labs in 3 months

## 2011-06-26 LAB — COMPREHENSIVE METABOLIC PANEL
Albumin: 4 g/dL (ref 3.5–5.2)
Calcium: 9.6 mg/dL (ref 8.4–10.5)
Chloride: 100 mEq/L (ref 96–112)
Potassium: 3.7 mEq/L (ref 3.5–5.3)
Total Bilirubin: 0.7 mg/dL (ref 0.3–1.2)
Total Protein: 7.6 g/dL (ref 6.0–8.3)

## 2011-06-26 LAB — LIPID PANEL
Cholesterol: 279 mg/dL — ABNORMAL HIGH (ref 0–200)
HDL: 33 mg/dL — ABNORMAL LOW (ref >39)
Total CHOL/HDL Ratio: 8.5 Ratio

## 2011-06-28 ENCOUNTER — Encounter: Payer: Self-pay | Admitting: Internal Medicine

## 2011-06-28 MED ORDER — ATORVASTATIN CALCIUM 20 MG PO TABS: 20.0000 mg | ORAL_TABLET | Freq: Every day | ORAL | Status: DC

## 2011-06-28 NOTE — Addendum Note (Signed)
Addended by: Tonye Pearson on: 06/28/2011 11:28 PM   Modules accepted: Orders

## 2012-01-23 ENCOUNTER — Ambulatory Visit (INDEPENDENT_AMBULATORY_CARE_PROVIDER_SITE_OTHER): Payer: BC Managed Care – PPO | Admitting: Family Medicine

## 2012-01-23 VITALS — BP 164/104 | HR 80 | Temp 98.4°F | Resp 16 | Ht 67.0 in | Wt 197.0 lb

## 2012-01-23 DIAGNOSIS — E785 Hyperlipidemia, unspecified: Secondary | ICD-10-CM

## 2012-01-23 DIAGNOSIS — I10 Essential (primary) hypertension: Secondary | ICD-10-CM

## 2012-01-23 LAB — POCT CBC
Granulocyte percent: 59 %G (ref 37–80)
MCH, POC: 26.7 pg — AB (ref 27–31.2)
MID (cbc): 0.5 (ref 0–0.9)
MPV: 9.3 fL (ref 0–99.8)
POC LYMPH PERCENT: 31.9 %L (ref 10–50)
POC MID %: 9.1 %M (ref 0–12)
Platelet Count, POC: 272 10*3/uL (ref 142–424)
RBC: 5.21 M/uL (ref 4.69–6.13)
RDW, POC: 15.8 %
WBC: 5.5 10*3/uL (ref 4.6–10.2)

## 2012-01-23 LAB — LIPID PANEL
Cholesterol: 309 mg/dL — ABNORMAL HIGH (ref 0–200)
HDL: 38 mg/dL — ABNORMAL LOW (ref >39)
LDL Cholesterol: 230 mg/dL — ABNORMAL HIGH (ref 0–99)
Triglycerides: 204 mg/dL — ABNORMAL HIGH (ref <150)

## 2012-01-23 LAB — BASIC METABOLIC PANEL
Chloride: 104 mEq/L (ref 96–112)
Creat: 1.17 mg/dL (ref 0.50–1.35)

## 2012-01-23 MED ORDER — LISINOPRIL-HYDROCHLOROTHIAZIDE 20-12.5 MG PO TABS: 1.0000 | ORAL_TABLET | Freq: Every day | ORAL | Status: DC

## 2012-01-23 MED ORDER — AMLODIPINE BESYLATE 10 MG PO TABS: 10.0000 mg | ORAL_TABLET | Freq: Every day | ORAL | Status: DC

## 2012-01-23 MED ORDER — ATORVASTATIN CALCIUM 20 MG PO TABS: 20.0000 mg | ORAL_TABLET | Freq: Every day | ORAL | Status: DC

## 2012-01-23 NOTE — Progress Notes (Signed)
[facility] [address] [place] [address] [phone]  Date:  01/23/2012   Name:  Tyler Klein   DOB:  22-Dec-1950   MRN:  [id number]  PCP:  Tally Due, MD    Chief Complaint: Medication Refill   History of Present Illness:  Tyler Klein is a 62 y.o. very pleasant male patient who presents with the following:  He is here to refill his BP medication today- he has been out for about 5 days.  He feels ok, no HA.  He changed types of cholesterol meds recently.  He is fasting today so we can check his progress.    He refuses flu shot today He was a little concerned because his GF was diagnosed with BV- he was not sure if he needs to take any treatment  Patient Active Problem List  Diagnosis  . HYPERLIPIDEMIA  . HYPERCALCEMIA  . ANEMIA-NOS  . ERECTILE DYSFUNCTION  . TOBACCO ABUSE  . HYPERTENSION  . DYSRHYTHMIA, CARDIAC NEC  . 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 PULMONARY DISEASE  . HEMATOCHEZIA  . RENAL INSUFFICIENCY, CHRONIC  . PNEUMONIA, HX OF    Past Medical History  Diagnosis Date  . Hypertension     Past Surgical History  Procedure Date  . Lung surgery 2011    left lung    History  Substance Use Topics  . Smoking status: Current Some Day Smoker  . Smokeless tobacco: Not on file  . Alcohol Use: Not on file    Family History  Problem Relation Age of Onset  . Hypertension Son   . Hypertension Sister     No Known Allergies  Medication list has been reviewed and updated.  Current Outpatient Prescriptions on File Prior to Visit  Medication Sig Dispense Refill  . amLODipine (NORVASC) 10 MG tablet Take 1 tablet (10 mg total) by mouth daily.  90 tablet  1  . atorvastatin (LIPITOR) 20 MG tablet Take 1 tablet (20 mg total) by mouth daily.  90 tablet  0  . lisinopril-hydrochlorothiazide (ZESTORETIC) 20-12.5 MG per tablet Take 1 tablet by mouth daily.  90 tablet  1  . loratadine (CLARITIN) 10 MG tablet Take 1 tablet (10 mg total)  by mouth daily. For allergies  30 tablet  11    Review of Systems:  As per HPI- otherwise negative.   Physical Examination: Filed Vitals:   01/23/12 1142  BP: 164/104  Pulse: 80  Temp: 98.4 F (36.9 C)  Resp: 16   Filed Vitals:   01/23/12 1142  Height: 5\' 7"  (1.702 m)  Weight: 197 lb (89.359 kg)   Body mass index is 30.85 kg/(m^2). Ideal Body Weight: Weight in (lb) to have BMI = 25: 159.3   GEN: WDWN, NAD, Non-toxic, A & O x 3, overweight HEENT: Atraumatic, Normocephalic. Neck supple. No masses, No LAD. Ears and Nose: No external deformity. CV: RRR, No M/G/R. No JVD. No thrill. No extra heart sounds. PULM: CTA B, no wheezes, crackles, rhonchi. No retractions. No resp. distress. No accessory muscle use. ABD: S, NT, ND EXTR: No c/c/e NEURO Normal gait.  PSYCH: Normally interactive. Conversant. Not depressed or anxious appearing.  Calm demeanor.   Results for orders placed in visit on 01/23/12  POCT CBC      Component Value Range   WBC 5.5  4.6 - 10.2 K/uL   Lymph, poc 1.8  0.6 - 3.4   POC LYMPH PERCENT 31.9  10 - 50 %  L   MID (cbc) 0.5  0 - 0.9   POC MID % 9.1  0 - 12 %M   POC Granulocyte 3.2  2 - 6.9   Granulocyte percent 59.0  37 - 80 %G   RBC 5.21  4.69 - 6.13 M/uL   Hemoglobin 13.9 (*) 14.1 - 18.1 g/dL   HCT, POC 16.1  09.6 - 53.7 %   MCV 86.7  80 - 97 fL   MCH, POC 26.7 (*) 27 - 31.2 pg   MCHC 30.8 (*) 31.8 - 35.4 g/dL   RDW, POC 04.5     Platelet Count, POC 272  142 - 424 K/uL   MPV 9.3  0 - 99.8 fL     Assessment and Plan: 1. HYPERTENSION  POCT CBC, Basic metabolic panel, amLODipine (NORVASC) 10 MG tablet, lisinopril-hydrochlorothiazide (ZESTORETIC) 20-12.5 MG per tablet  2. HYPERLIPIDEMIA  Lipid panel, atorvastatin (LIPITOR) 20 MG tablet   Refilled medications and check labs as above today Reassured that males do not need to be treated when their male partner has BV Will plan further follow- up pending labs- address slight  anemia   Nitara Szczerba, MD

## 2012-01-23 NOTE — Patient Instructions (Addendum)
I will be in touch with your labs- come and see [place] in about 6 months

## 2012-01-25 ENCOUNTER — Encounter: Payer: Self-pay | Admitting: Family Medicine

## 2013-02-16 ENCOUNTER — Ambulatory Visit (INDEPENDENT_AMBULATORY_CARE_PROVIDER_SITE_OTHER): Payer: BC Managed Care – PPO | Admitting: Physician Assistant

## 2013-02-16 VITALS — BP 138/70 | HR 76 | Temp 97.9°F | Resp 16 | Ht 67.5 in | Wt 208.0 lb

## 2013-02-16 DIAGNOSIS — Z125 Encounter for screening for malignant neoplasm of prostate: Secondary | ICD-10-CM

## 2013-02-16 DIAGNOSIS — F17200 Nicotine dependence, unspecified, uncomplicated: Secondary | ICD-10-CM

## 2013-02-16 DIAGNOSIS — N189 Chronic kidney disease, unspecified: Secondary | ICD-10-CM

## 2013-02-16 DIAGNOSIS — I10 Essential (primary) hypertension: Secondary | ICD-10-CM

## 2013-02-16 DIAGNOSIS — Z1211 Encounter for screening for malignant neoplasm of colon: Secondary | ICD-10-CM

## 2013-02-16 DIAGNOSIS — E785 Hyperlipidemia, unspecified: Secondary | ICD-10-CM

## 2013-02-16 DIAGNOSIS — D649 Anemia, unspecified: Secondary | ICD-10-CM

## 2013-02-16 DIAGNOSIS — Z1159 Encounter for screening for other viral diseases: Secondary | ICD-10-CM

## 2013-02-16 LAB — POCT CBC
Granulocyte percent: 56.9 %G (ref 37–80)
HEMATOCRIT: 45.3 % (ref 43.5–53.7)
HEMOGLOBIN: 13.9 g/dL — AB (ref 14.1–18.1)
LYMPH, POC: 2.3 (ref 0.6–3.4)
MCH: 27.5 pg (ref 27–31.2)
MCHC: 30.7 g/dL — AB (ref 31.8–35.4)
MCV: 89.5 fL (ref 80–97)
MID (cbc): 0.7 (ref 0–0.9)
MPV: 9.5 fL (ref 0–99.8)
POC Granulocyte: 4 (ref 2–6.9)
POC LYMPH PERCENT: 33 %L (ref 10–50)
POC MID %: 10.1 % (ref 0–12)
Platelet Count, POC: 250 10*3/uL (ref 142–424)
RBC: 5.06 M/uL (ref 4.69–6.13)
RDW, POC: 15.8 %
WBC: 7.1 10*3/uL (ref 4.6–10.2)

## 2013-02-16 MED ORDER — AMLODIPINE BESYLATE 10 MG PO TABS: 10.0000 mg | ORAL_TABLET | Freq: Every day | ORAL | Status: DC

## 2013-02-16 MED ORDER — ATORVASTATIN CALCIUM 20 MG PO TABS: 20.0000 mg | ORAL_TABLET | Freq: Every day | ORAL | Status: DC

## 2013-02-16 MED ORDER — LISINOPRIL-HYDROCHLOROTHIAZIDE 20-12.5 MG PO TABS: 1.0000 | ORAL_TABLET | Freq: Every day | ORAL | Status: DC
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
## 2013-02-16 NOTE — Patient Instructions (Signed)
Please schedule a colonoscopy.

## 2013-02-17 ENCOUNTER — Encounter: Payer: Self-pay | Admitting: Physician Assistant

## 2013-02-17 LAB — COMPREHENSIVE METABOLIC PANEL
ALT: 48 U/L (ref 0–53)
AST: 28 U/L (ref 0–37)
Albumin: 4.1 g/dL (ref 3.5–5.2)
Alkaline Phosphatase: 74 U/L (ref 39–117)
BILIRUBIN TOTAL: 0.5 mg/dL (ref 0.2–1.2)
BUN: 12 mg/dL (ref 6–23)
CALCIUM: 10 mg/dL (ref 8.4–10.5)
CO2: 33 meq/L — AB (ref 19–32)
CREATININE: 1.38 mg/dL — AB (ref 0.50–1.35)
Chloride: 100 mEq/L (ref 96–112)
GLUCOSE: 93 mg/dL (ref 70–99)
Potassium: 4.1 mEq/L (ref 3.5–5.3)
Sodium: 138 mEq/L (ref 135–145)
Total Protein: 7.9 g/dL (ref 6.0–8.3)

## 2013-02-17 LAB — LIPID PANEL
CHOL/HDL RATIO: 4.9 ratio
CHOLESTEROL: 178 mg/dL (ref 0–200)
HDL: 36 mg/dL — AB (ref >39)
LDL Cholesterol: 115 mg/dL — ABNORMAL HIGH (ref 0–99)
TRIGLYCERIDES: 135 mg/dL (ref <150)
VLDL: 27 mg/dL (ref 0–40)

## 2013-02-17 LAB — PSA: PSA: 1.27 ng/mL (ref <=4.00)

## 2013-02-17 LAB — HEPATITIS C ANTIBODY: HCV Ab: NEGATIVE

## 2013-02-18 ENCOUNTER — Encounter: Payer: Self-pay | Admitting: Physician Assistant

## 2013-02-18 DIAGNOSIS — R0683 Snoring: Secondary | ICD-10-CM | POA: Insufficient documentation

## 2013-02-18 DIAGNOSIS — G479 Sleep disorder, unspecified: Secondary | ICD-10-CM

## 2014-02-24 ENCOUNTER — Other Ambulatory Visit: Payer: Self-pay | Admitting: Physician Assistant

## 2014-02-24 ENCOUNTER — Ambulatory Visit (INDEPENDENT_AMBULATORY_CARE_PROVIDER_SITE_OTHER): Payer: BLUE CROSS/BLUE SHIELD | Admitting: Emergency Medicine

## 2014-02-24 VITALS — BP 112/80 | HR 72 | Temp 97.7°F | Resp 16 | Ht 67.5 in | Wt 204.0 lb

## 2014-02-24 DIAGNOSIS — E782 Mixed hyperlipidemia: Secondary | ICD-10-CM

## 2014-02-24 DIAGNOSIS — Z720 Tobacco use: Secondary | ICD-10-CM

## 2014-02-24 DIAGNOSIS — F17200 Nicotine dependence, unspecified, uncomplicated: Secondary | ICD-10-CM

## 2014-02-24 DIAGNOSIS — I10 Essential (primary) hypertension: Secondary | ICD-10-CM

## 2014-02-24 LAB — CBC WITH DIFFERENTIAL/PLATELET
BASOS PCT: 1 % (ref 0–1)
Basophils Absolute: 0.1 10*3/uL (ref 0.0–0.1)
Eosinophils Absolute: 0.4 10*3/uL (ref 0.0–0.7)
Eosinophils Relative: 7 % — ABNORMAL HIGH (ref 0–5)
HCT: 44.8 % (ref 39.0–52.0)
Hemoglobin: 14.9 g/dL (ref 13.0–17.0)
LYMPHS ABS: 1.8 10*3/uL (ref 0.7–4.0)
Lymphocytes Relative: 29 % (ref 12–46)
MCH: 27.2 pg (ref 26.0–34.0)
MCHC: 33.3 g/dL (ref 30.0–36.0)
MCV: 81.8 fL (ref 78.0–100.0)
MONO ABS: 0.7 10*3/uL (ref 0.1–1.0)
MONOS PCT: 12 % (ref 3–12)
MPV: 10.4 fL (ref 8.6–12.4)
NEUTROS ABS: 3.1 10*3/uL (ref 1.7–7.7)
Neutrophils Relative %: 51 % (ref 43–77)
Platelets: 261 10*3/uL (ref 150–400)
RBC: 5.48 MIL/uL (ref 4.22–5.81)
RDW: 16 % — AB (ref 11.5–15.5)
WBC: 6.1 10*3/uL (ref 4.0–10.5)

## 2014-02-24 MED ORDER — LISINOPRIL-HYDROCHLOROTHIAZIDE 20-12.5 MG PO TABS: 1.0000 | ORAL_TABLET | Freq: Every day | ORAL | Status: DC

## 2014-02-24 MED ORDER — ATORVASTATIN CALCIUM 20 MG PO TABS: 20.0000 mg | ORAL_TABLET | Freq: Every day | ORAL | Status: DC

## 2014-02-24 MED ORDER — AMLODIPINE BESYLATE 10 MG PO TABS: 10.0000 mg | ORAL_TABLET | Freq: Every day | ORAL | Status: DC

## 2014-02-24 NOTE — Progress Notes (Signed)
[facility] [address] [place] [address] [phone]  Date:  02/24/2014   Name:  Tyler Klein   DOB:  22-Nov-1950   MRN:  [id number]  PCP:  Kennon Portela, MD    Chief Complaint: Annual Exam   History of Present Illness:  Tyler Klein is a 64 y.o. very pleasant male patient who presents with the following:  History of HBP, HLD, obesity and tobacco abuse Comes for repeat labs and refills No improvement with over the counter medications or other home remedies.  Denies other complaint or health concern today.   Patient Active Problem List   Diagnosis Date Noted  . Disturbance of sleep 02/18/2013  . HEMATOCHEZIA 03/24/2008  . HYPERLIPIDEMIA 02/04/2006  . HYPERCALCEMIA 02/04/2006  . ANEMIA-NOS 02/04/2006  . ERECTILE DYSFUNCTION 02/04/2006  . TOBACCO ABUSE 02/04/2006  . HYPERTENSION 02/04/2006  . [place], CARDIAC NEC 02/04/2006  . 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 PULMONARY DISEASE 02/04/2006  . RENAL INSUFFICIENCY, CHRONIC 02/04/2006  . PNEUMONIA, HX OF 02/04/2006    Past Medical History  Diagnosis Date  . Hypertension   . HSV-1 (herpes simplex virus 1) infection     Past Surgical History  Procedure Laterality Date  . Lung surgery  2011    left lung thoracotomy (01/2006)    History  Substance Use Topics  . Smoking status: Current Some Day Smoker  . Smokeless tobacco: Not on file  . Alcohol Use: Not on file    Family History  Problem Relation Age of Onset  . Hypertension Son   . Hypertension Sister   . Hypertension Mother     No Known Allergies  Medication list has been reviewed and updated.  Current Outpatient Prescriptions on File Prior to Visit  Medication Sig Dispense Refill  . amLODipine (NORVASC) 10 MG tablet Take 1 tablet (10 mg total) by mouth daily. 90 tablet 3  . atorvastatin (LIPITOR) 20 MG tablet Take 1 tablet (20 mg total) by mouth daily. 90 tablet 3  . lisinopril-hydrochlorothiazide (ZESTORETIC)  20-12.5 MG per tablet Take 1 tablet by mouth daily. 90 tablet 3   No current facility-administered medications on file prior to visit.    Review of Systems:  As per HPI, otherwise negative.    Physical Examination: Filed Vitals:   02/24/14 1507  BP: 112/80  Pulse: 72  Temp: 97.7 F (36.5 C)  Resp: 16   Filed Vitals:   02/24/14 1507  Height: 5' 7.5" (1.715 m)  Weight: 204 lb (92.534 kg)   Body mass index is 31.46 kg/(m^2). Ideal Body Weight: Weight in (lb) to have BMI = 25: 161.7  GEN: WDWN, NAD, Non-toxic, A & O x 3 HEENT: Atraumatic, Normocephalic. Neck supple. No masses, No LAD. Ears and Nose: No external deformity. CV: RRR, No M/G/R. No JVD. No thrill. No extra heart sounds. PULM: CTA B, no wheezes, crackles, rhonchi. No retractions. No resp. distress. No accessory muscle use. ABD: S, NT, ND, +BS. No rebound. No HSM. EXTR: No c/c/e NEURO Normal gait.  PSYCH: Normally interactive. Conversant. Not depressed or anxious appearing.  Calm demeanor.    Assessment and Plan: Hypertension HLD Obesity  Signed,  Ellison Carwin, MD

## 2014-02-24 NOTE — Addendum Note (Signed)
Addended by: Venetia Night on: 02/24/2014 04:29 PM   Modules accepted: Orders

## 2014-02-24 NOTE — Patient Instructions (Signed)

## 2014-02-25 LAB — COMPREHENSIVE METABOLIC PANEL
ALT: 39 U/L (ref 0–53)
AST: 24 U/L (ref 0–37)
Albumin: 4 g/dL (ref 3.5–5.2)
Alkaline Phosphatase: 69 U/L (ref 39–117)
BUN: 16 mg/dL (ref 6–23)
CALCIUM: 9.8 mg/dL (ref 8.4–10.5)
CO2: 32 meq/L (ref 19–32)
Chloride: 102 mEq/L (ref 96–112)
Creat: 1.19 mg/dL (ref 0.50–1.35)
Glucose, Bld: 75 mg/dL (ref 70–99)
Potassium: 4 mEq/L (ref 3.5–5.3)
Sodium: 142 mEq/L (ref 135–145)
Total Bilirubin: 0.7 mg/dL (ref 0.2–1.2)
Total Protein: 7.6 g/dL (ref 6.0–8.3)

## 2014-02-25 LAB — LIPID PANEL
CHOLESTEROL: 197 mg/dL (ref 0–200)
HDL: 37 mg/dL — ABNORMAL LOW (ref >39)
LDL Cholesterol: 129 mg/dL — ABNORMAL HIGH (ref 0–99)
Total CHOL/HDL Ratio: 5.3 Ratio
Triglycerides: 156 mg/dL — ABNORMAL HIGH (ref <150)
VLDL: 31 mg/dL (ref 0–40)

## 2014-02-25 LAB — PSA: PSA: 1.73 ng/mL (ref <=4.00)

## 2014-09-28 ENCOUNTER — Other Ambulatory Visit (INDEPENDENT_AMBULATORY_CARE_PROVIDER_SITE_OTHER): Payer: BLUE CROSS/BLUE SHIELD

## 2014-09-28 ENCOUNTER — Ambulatory Visit (INDEPENDENT_AMBULATORY_CARE_PROVIDER_SITE_OTHER): Payer: BLUE CROSS/BLUE SHIELD | Admitting: Internal Medicine

## 2014-09-28 ENCOUNTER — Encounter: Payer: Self-pay | Admitting: Internal Medicine

## 2014-09-28 VITALS — BP 136/88 | HR 84 | Temp 98.1°F | Resp 16 | Ht 66.0 in | Wt 197.0 lb

## 2014-09-28 DIAGNOSIS — Z720 Tobacco use: Secondary | ICD-10-CM

## 2014-09-28 DIAGNOSIS — R0683 Snoring: Secondary | ICD-10-CM

## 2014-09-28 DIAGNOSIS — F17200 Nicotine dependence, unspecified, uncomplicated: Secondary | ICD-10-CM

## 2014-09-28 DIAGNOSIS — E785 Hyperlipidemia, unspecified: Secondary | ICD-10-CM | POA: Diagnosis not present

## 2014-09-28 DIAGNOSIS — Z0000 Encounter for general adult medical examination without abnormal findings: Secondary | ICD-10-CM | POA: Diagnosis not present

## 2014-09-28 DIAGNOSIS — R7989 Other specified abnormal findings of blood chemistry: Secondary | ICD-10-CM | POA: Diagnosis not present

## 2014-09-28 DIAGNOSIS — I10 Essential (primary) hypertension: Secondary | ICD-10-CM

## 2014-09-28 DIAGNOSIS — F528 Other sexual dysfunction not due to a substance or known physiological condition: Secondary | ICD-10-CM

## 2014-09-28 DIAGNOSIS — Z1211 Encounter for screening for malignant neoplasm of colon: Secondary | ICD-10-CM | POA: Diagnosis not present

## 2014-09-28 LAB — LIPID PANEL
CHOL/HDL RATIO: 7
CHOLESTEROL: 201 mg/dL — AB (ref 0–200)
HDL: 30.9 mg/dL — AB (ref >39.00)
NonHDL: 169.98
TRIGLYCERIDES: 232 mg/dL — AB (ref 0.0–149.0)
VLDL: 46.4 mg/dL — ABNORMAL HIGH (ref 0.0–40.0)

## 2014-09-28 LAB — HEMOGLOBIN A1C: Hgb A1c MFr Bld: 6.3 % (ref 4.6–6.5)

## 2014-09-28 LAB — COMPREHENSIVE METABOLIC PANEL
ALT: 24 U/L (ref 0–53)
AST: 16 U/L (ref 0–37)
Albumin: 3.9 g/dL (ref 3.5–5.2)
Alkaline Phosphatase: 60 U/L (ref 39–117)
BUN: 19 mg/dL (ref 6–23)
CALCIUM: 10.2 mg/dL (ref 8.4–10.5)
CHLORIDE: 102 meq/L (ref 96–112)
CO2: 32 mEq/L (ref 19–32)
Creatinine, Ser: 1.23 mg/dL (ref 0.40–1.50)
GFR: 76.22 mL/min (ref >60.00)
Glucose, Bld: 102 mg/dL — ABNORMAL HIGH (ref 70–99)
Potassium: 3.5 mEq/L (ref 3.5–5.1)
SODIUM: 139 meq/L (ref 135–145)
Total Bilirubin: 0.5 mg/dL (ref 0.2–1.2)
Total Protein: 7.8 g/dL (ref 6.0–8.3)

## 2014-09-28 LAB — LDL CHOLESTEROL, DIRECT: Direct LDL: 135 mg/dL

## 2014-09-28 NOTE — Assessment & Plan Note (Signed)
Home sleep study put in today to evaluate for OSA. Has some fullness of the oropharynx which could be contributing to his sleeping problems.

## 2014-09-28 NOTE — Patient Instructions (Signed)
We will check the blood work today and call you back with the results.   We will get the sleep study done and you will here back about that.   We will also get you in with the stomach doctor to get the colon cancer screening.   Health Maintenance A healthy lifestyle and preventative care can promote health and wellness.  Maintain regular health, dental, and eye exams.  Eat a healthy diet. Foods like vegetables, fruits, whole grains, low-fat dairy products, and lean protein foods contain the nutrients you need and are low in calories. Decrease your intake of foods high in solid fats, added sugars, and salt. Get information about a proper diet from your health care Lareta Bruneau, if necessary.  Regular physical exercise is one of the most important things you can do for your health. Most adults should get at least 150 minutes of moderate-intensity exercise (any activity that increases your heart rate and causes you to sweat) each week. In addition, most adults need muscle-strengthening exercises on 2 or more days a week.   Maintain a healthy weight. The body mass index (BMI) is a screening tool to identify possible weight problems. It provides an estimate of body fat based on height and weight. Your health care Xoey Warmoth can find your BMI and can help you achieve or maintain a healthy weight. For males 20 years and older:  A BMI below 18.5 is considered underweight.  A BMI of 18.5 to 24.9 is normal.  A BMI of 25 to 29.9 is considered overweight.  A BMI of 30 and above is considered obese.  Maintain normal blood lipids and cholesterol by exercising and minimizing your intake of saturated fat. Eat a balanced diet with plenty of fruits and vegetables. Blood tests for lipids and cholesterol should begin at age 5 and be repeated every 5 years. If your lipid or cholesterol levels are high, you are over age 42, or you are at high risk for heart disease, you may need your cholesterol levels checked more  frequently.Ongoing high lipid and cholesterol levels should be treated with medicines if diet and exercise are not working.  If you smoke, find out from your health care Coraline Talwar how to quit. If you do not use tobacco, do not start.  Lung cancer screening is recommended for adults aged 60-80 years who are at high risk for developing lung cancer because of a history of smoking. A yearly low-dose CT scan of the lungs is recommended for people who have at least a 30-pack-year history of smoking and are current smokers or have quit within the past 15 years. A pack year of smoking is smoking an average of 1 pack of cigarettes a day for 1 year (for example, a 30-pack-year history of smoking could mean smoking 1 pack a day for 30 years or 2 packs a day for 15 years). Yearly screening should continue until the smoker has stopped smoking for at least 15 years. Yearly screening should be stopped for people who develop a health problem that would prevent them from having lung cancer treatment.  If you choose to drink alcohol, do not have more than 2 drinks per day. One drink is considered to be 12 oz (360 mL) of beer, 5 oz (150 mL) of wine, or 1.5 oz (45 mL) of liquor.  Avoid the use of street drugs. Do not share needles with anyone. Ask for help if you need support or instructions about stopping the use of drugs.  High blood  pressure causes heart disease and increases the risk of stroke. Blood pressure should be checked at least every 1-2 years. Ongoing high blood pressure should be treated with medicines if weight loss and exercise are not effective.  If you are 42-63 years old, ask your health care Ersel Enslin if you should take aspirin to prevent heart disease.  Diabetes screening involves taking a blood sample to check your fasting blood sugar level. This should be done once every 3 years after age 60 if you are at a normal weight and without risk factors for diabetes. Testing should be considered at a younger  age or be carried out more frequently if you are overweight and have at least 1 risk factor for diabetes.  Colorectal cancer can be detected and often prevented. Most routine colorectal cancer screening begins at the age of 47 and continues through age 85. However, your health care Brylie Sneath may recommend screening at an earlier age if you have risk factors for colon cancer. On a yearly basis, your health care Marcoantonio Legault may provide home test kits to check for hidden blood in the stool. A small camera at the end of a tube may be used to directly examine the colon (sigmoidoscopy or colonoscopy) to detect the earliest forms of colorectal cancer. Talk to your health care Gedeon Brandow about this at age 54 when routine screening begins. A direct exam of the colon should be repeated every 5-10 years through age 78, unless early forms of precancerous polyps or small growths are found.  People who are at an increased risk for hepatitis B should be screened for this virus. You are considered at high risk for hepatitis B if:  You were born in a country where hepatitis B occurs often. Talk with your health care Criselda Starke about which countries are considered high risk.  Your parents were born in a high-risk country and you have not received a shot to protect against hepatitis B (hepatitis B vaccine).  You have HIV or AIDS.  You use needles to inject street drugs.  You live with, or have sex with, someone who has hepatitis B.  You are a man who has sex with other men (MSM).  You get hemodialysis treatment.  You take certain medicines for conditions like cancer, organ transplantation, and autoimmune conditions.  Hepatitis C blood testing is recommended for all people born from [birth year] through 1965 and any individual with known risk factors for hepatitis C.  Healthy men should no longer receive prostate-specific antigen (PSA) blood tests as part of routine cancer screening. Talk to your health care Catalyna Reilly about  prostate cancer screening.  Testicular cancer screening is not recommended for adolescents or adult males who have no symptoms. Screening includes self-exam, a health care Krysia Zahradnik exam, and other screening tests. Consult with your health care Decorey Wahlert about any symptoms you have or any concerns you have about testicular cancer.  Practice safe sex. Use condoms and avoid high-risk sexual practices to reduce the spread of sexually transmitted infections (STIs).  You should be screened for STIs, including gonorrhea and chlamydia if:  You are sexually active and are younger than 24 years.  You are older than 24 years, and your health care Rudy Domek tells you that you are at risk for this type of infection.  Your sexual activity has changed since you were last screened, and you are at an increased risk for chlamydia or gonorrhea. Ask your health care Hitesh Fouche if you are at risk.  If you are at risk  of being infected with HIV, it is recommended that you take a prescription medicine daily to prevent HIV infection. This is called pre-exposure prophylaxis (PrEP). You are considered at risk if:  You are a man who has sex with other men (MSM).  You are a heterosexual man who is sexually active with multiple partners.  You take drugs by injection.  You are sexually active with a partner who has HIV.  Talk with your health care Meital Riehl about whether you are at high risk of being infected with HIV. If you choose to begin PrEP, you should first be tested for HIV. You should then be tested every 3 months for as long as you are taking PrEP.  Use sunscreen. Apply sunscreen liberally and repeatedly throughout the day. You should seek shade when your shadow is shorter than you. Protect yourself by wearing long sleeves, pants, a wide-brimmed hat, and sunglasses year round whenever you are outdoors.  Tell your health care Mizuki Hoel of new moles or changes in moles, especially if there is a change in shape or  color. Also, tell your health care Moira Umholtz if a mole is larger than the size of a pencil eraser.  A one-time screening for abdominal aortic aneurysm (AAA) and surgical repair of large AAAs by ultrasound is recommended for men aged 48-75 years who are current or former smokers.  Stay current with your vaccines (immunizations). Document Released: 06/23/2007 Document Revised: 12/30/2012 Document Reviewed: 05/22/2010 [facility] Patient Information 2015 [place], [place]. This information is not intended to replace advice given to you by your health care Elizardo Chilson. Make sure you discuss any questions you have with your health care Mardell Suttles.

## 2014-09-28 NOTE — Assessment & Plan Note (Signed)
BP at goal on lisinopril/hctz and amlodipine. Checking CMP and adjust as needed. No side effects.

## 2014-09-28 NOTE — Progress Notes (Signed)
   Subjective:    Patient ID: Tyler Klein, male    DOB: Mar 16, 1950, 64 y.o.   MRN: [id number]  HPI The patient is a 64 YO man coming in for several concerns. He is snoring a lot lately and his girlfriend thinks that he stops breathing at night time which is concerning to her. He wants to get checked out for that. No morning headaches. Somewhat tired throughout the day.  Next concern is that he has high blood pressure and needs continuity with his care. Has done well with lisinopril/hctz and amlodipine for the last several years. Does have some CKD. No headaches, chest pains, nausea, abdominal pains. Takes his medicine daily.   PMH, [facility], social history reviewed and updated.   Review of Systems  Constitutional: Negative for fever, chills, activity change, appetite change and unexpected weight change.  HENT: Negative.   Eyes: Negative.   Respiratory: Negative for cough, chest tightness, shortness of breath and wheezing.        Snoring  Cardiovascular: Negative for chest pain, palpitations and leg swelling.  Gastrointestinal: Negative for nausea, abdominal pain, diarrhea, constipation, blood in stool and abdominal distention.  Musculoskeletal: Negative.   Skin: Negative.   Neurological: Negative.   Psychiatric/Behavioral: Negative.       Objective:   Physical Exam  Constitutional: He is oriented to person, place, and time. He appears well-developed and well-nourished.  Overweight  HENT:  Head: Normocephalic and atraumatic.  Eyes: EOM are normal.  Neck: Normal range of motion.  Cardiovascular: Normal rate and regular rhythm.   No murmur heard. Pulmonary/Chest: Effort normal and breath sounds normal. No respiratory distress. He has no wheezes. He has no rales.  Abdominal: Soft. Bowel sounds are normal. He exhibits no distension. There is no tenderness. There is no rebound.  Musculoskeletal: He exhibits no edema.  Neurological: He is alert and oriented to person, place, and time. No  cranial nerve deficit. Coordination normal.  Skin: Skin is warm and dry.  Psychiatric: He has a normal mood and affect.   Filed Vitals:   09/28/14 1305  BP: 136/88  Pulse: 84  Temp: 98.1 F (36.7 C)  TempSrc: Oral  Resp: 16  Height: 5\' 6"  (1.676 m)  Weight: 197 lb (89.359 kg)  SpO2: 97%      Assessment & Plan:

## 2014-09-28 NOTE — Assessment & Plan Note (Signed)
Still smoking and talked to him about the need to quit. He is pre-contemplative at this time.

## 2014-09-28 NOTE — Progress Notes (Signed)
Pre visit review using our clinic review tool, if applicable. No additional management support is needed unless otherwise documented below in the visit note. 

## 2014-09-28 NOTE — Assessment & Plan Note (Signed)
Checking lipid panel, on lipitor daily without side effects. Adjust as needed based on labs.

## 2014-09-28 NOTE — Assessment & Plan Note (Addendum)
Has had side effects from viagra in the past. Tried cialis in the past with good success but does not want rx today due to cost. Denies any BPH symptoms.

## 2014-10-01 ENCOUNTER — Other Ambulatory Visit: Payer: Self-pay | Admitting: Internal Medicine

## 2014-10-01 MED ORDER — TADALAFIL 10 MG PO TABS: 10.0000 mg | ORAL_TABLET | Freq: Every day | ORAL | Status: DC | PRN

## 2014-10-14 ENCOUNTER — Encounter: Payer: Self-pay | Admitting: Internal Medicine

## 2014-10-14 ENCOUNTER — Telehealth: Payer: Self-pay

## 2014-10-14 NOTE — Telephone Encounter (Signed)
covermymeds KEY [id number]

## 2014-12-01 ENCOUNTER — Ambulatory Visit (AMBULATORY_SURGERY_CENTER): Payer: Self-pay | Admitting: *Deleted

## 2014-12-01 VITALS — Ht 66.0 in | Wt 197.0 lb

## 2014-12-01 DIAGNOSIS — Z1211 Encounter for screening for malignant neoplasm of colon: Secondary | ICD-10-CM

## 2014-12-01 MED ORDER — NA SULFATE-K SULFATE-MG SULF 17.5-3.13-1.6 GM/177ML PO SOLN: 1.0000 | Freq: Once | ORAL | Status: DC

## 2014-12-01 NOTE — Progress Notes (Signed)
No egg or soy allergy No issues with past sedation No diet pills No home 02 use emmi video declined  

## 2014-12-15 ENCOUNTER — Ambulatory Visit (AMBULATORY_SURGERY_CENTER): Payer: BLUE CROSS/BLUE SHIELD | Admitting: Internal Medicine

## 2014-12-15 ENCOUNTER — Encounter: Payer: Self-pay | Admitting: Internal Medicine

## 2014-12-15 VITALS — BP 137/85 | HR 67 | Temp 97.1°F | Resp 18 | Ht 66.0 in | Wt 197.0 lb

## 2014-12-15 DIAGNOSIS — D124 Benign neoplasm of descending colon: Secondary | ICD-10-CM

## 2014-12-15 DIAGNOSIS — D122 Benign neoplasm of ascending colon: Secondary | ICD-10-CM | POA: Diagnosis not present

## 2014-12-15 DIAGNOSIS — K635 Polyp of colon: Secondary | ICD-10-CM | POA: Diagnosis not present

## 2014-12-15 DIAGNOSIS — D129 Benign neoplasm of anus and anal canal: Secondary | ICD-10-CM

## 2014-12-15 DIAGNOSIS — D125 Benign neoplasm of sigmoid colon: Secondary | ICD-10-CM

## 2014-12-15 DIAGNOSIS — D128 Benign neoplasm of rectum: Secondary | ICD-10-CM

## 2014-12-15 DIAGNOSIS — K621 Rectal polyp: Secondary | ICD-10-CM

## 2014-12-15 DIAGNOSIS — Z1211 Encounter for screening for malignant neoplasm of colon: Secondary | ICD-10-CM

## 2014-12-15 HISTORY — PX: COLONOSCOPY: SHX174

## 2014-12-15 MED ORDER — SODIUM CHLORIDE 0.9 % IV SOLN: 500.0000 mL | INTRAVENOUS | Status: DC

## 2014-12-15 NOTE — Op Note (Signed)
[place]  [address] [place], [address]   COLONOSCOPY PROCEDURE REPORT  PATIENT: Tyler, Klein  MR#: [id number] BIRTHDATE: 1950/09/30 , 59  yrs. old GENDER: male ENDOSCOPIST: Jerene Bears, MD REFERRED BY: Pricilla Holm PROCEDURE DATE:  12/15/2014 PROCEDURE:   Colonoscopy, screening and Colonoscopy with snare polypectomy First Screening Colonoscopy - Avg.  risk and is 50 yrs.  old or older Yes.  Prior Negative Screening - Now for repeat screening. N/A  History of Adenoma - Now for follow-up colonoscopy & has been > or = to 3 yrs.  N/A  Polyps removed today? Yes ASA CLASS:   Class II INDICATIONS:Screening for colonic neoplasia, Colorectal Neoplasm Risk Assessment for this procedure is average risk, and first colonoscopy. MEDICATIONS: Monitored anesthesia care and Propofol 120 mg IV  DESCRIPTION OF PROCEDURE:   After the risks benefits and alternatives of the procedure were thoroughly explained, informed consent was obtained.  The digital rectal exam revealed no rectal mass.   The LB [id number]  endoscope was introduced through the anus and advanced to the cecum, which was identified by both the appendix and ileocecal valve. No adverse events experienced. The quality of the prep was good.  (Suprep was used)  The instrument was then slowly withdrawn as the colon was fully examined. Estimated blood loss is zero unless otherwise noted in this procedure report.   COLON FINDINGS: A sessile polyp measuring 6 mm in size was found in the ascending colon.  A polypectomy was performed with a cold snare.  The resection was complete, the polyp tissue was completely retrieved and sent to histology.   Four sessile polyps ranging between 3-69mm in size were found in the descending colon, sigmoid colon, and rectum.  Polypectomies were performed with a cold snare. The resection was complete, the polyp tissue was completely retrieved and sent to histology.   Retroflexed views revealed moderate to large internal hemorrhoids. The time to cecum = 1.6 Withdrawal time = 10.8   The scope was withdrawn and the procedure completed. COMPLICATIONS: There were no immediate complications.  ENDOSCOPIC IMPRESSION: 1.   Sessile polyp was found in the ascending colon; polypectomy was performed with a cold snare 2.   Four sessile polyps ranging between 3-36mm in size were found in the descending colon, sigmoid colon, and rectum; polypectomies were performed with a cold snare  RECOMMENDATIONS: 1.  Await pathology results 2.  Timing of repeat colonoscopy will be determined by pathology findings. 3.  You will receive a letter within 1-2 weeks with the results of your biopsy as well as final recommendations.  Please call my office if you have not received a letter after 3 weeks.  eSigned:  Jerene Bears, MD 12/15/2014 11:49 AM cc: the patient, PCP

## 2014-12-15 NOTE — Patient Instructions (Signed)
YOU HAD AN ENDOSCOPIC PROCEDURE TODAY AT [facility]:   Refer to the procedure report that was given to you for any specific questions about what was found during the examination.  If the procedure report does not answer your questions, please call your gastroenterologist to clarify.  If you requested that your care partner not be given the details of your procedure findings, then the procedure report has been included in a sealed envelope for you to review at your convenience later.  YOU SHOULD EXPECT: Some feelings of bloating in the abdomen. Passage of more gas than usual.  Walking can help get rid of the air that was put into your GI tract during the procedure and reduce the bloating. If you had a lower endoscopy (such as a colonoscopy or flexible sigmoidoscopy) you may notice spotting of blood in your stool or on the toilet paper. If you underwent a bowel prep for your procedure, you may not have a normal bowel movement for a few days.  Please Note:  You might notice some irritation and congestion in your nose or some drainage.  This is from the oxygen used during your procedure.  There is no need for concern and it should clear up in a day or so.  SYMPTOMS TO REPORT IMMEDIATELY:   Following lower endoscopy (colonoscopy or flexible sigmoidoscopy):  Excessive amounts of blood in the stool  Significant tenderness or worsening of abdominal pains  Swelling of the abdomen that is new, acute  Fever of 100F or higher  For urgent or emergent issues, a gastroenterologist can be reached at any hour by calling [phone].   DIET: Your first meal following the procedure should be a small meal and then it is ok to progress to your normal diet. Heavy or fried foods are harder to digest and may make you feel nauseous or bloated.  Likewise, meals heavy in dairy and vegetables can increase bloating.  Drink plenty of fluids but you should avoid alcoholic beverages for 24  hours.  ACTIVITY:  You should plan to take it easy for the rest of today and you should NOT DRIVE or use heavy machinery until tomorrow (because of the sedation medicines used during the test).    FOLLOW UP: Our staff will call the number listed on your records the next business day following your procedure to check on you and address any questions or concerns that you may have regarding the information given to you following your procedure. If we do not reach you, we will leave a message.  However, if you are feeling well and you are not experiencing any problems, there is no need to return our call.  We will assume that you have returned to your regular daily activities without incident.  If any biopsies were taken you will be contacted by phone or by letter within the next 1-3 weeks.  Please call us at [phone] if you have not heard about the biopsies in 3 weeks.    SIGNATURES/CONFIDENTIALITY: You and/or your care partner have signed paperwork which will be entered into your electronic medical record.  These signatures attest to the fact that that the information above on your After Visit Summary has been reviewed and is understood.  Full responsibility of the confidentiality of this discharge information lies with you and/or your care-partner.  Polyps, hemorrhoids-handouts given  Repeat colonoscopy will be determined by pathology.

## 2014-12-15 NOTE — Progress Notes (Signed)
Report to PACU, RN, vss, BBS= Clear.  

## 2014-12-15 NOTE — Progress Notes (Signed)
Called to room to assist during endoscopic procedure.  Patient ID and intended procedure confirmed with present staff. Received instructions for my participation in the procedure from the performing physician.  

## 2014-12-16 ENCOUNTER — Telehealth: Payer: Self-pay

## 2014-12-16 ENCOUNTER — Telehealth: Payer: Self-pay | Admitting: *Deleted

## 2014-12-16 ENCOUNTER — Other Ambulatory Visit: Payer: Self-pay

## 2014-12-16 DIAGNOSIS — G4730 Sleep apnea, unspecified: Secondary | ICD-10-CM

## 2014-12-16 NOTE — Telephone Encounter (Signed)
  Follow up Call-  Call back number 12/15/2014  Post procedure Call Back phone  # [phone]  Permission to leave phone message Yes     No answer at # given.  Left message on VM.

## 2014-12-16 NOTE — Telephone Encounter (Signed)
Pt scheduled to see Dr. Elsworth Soho with [facility] 03/29/14 for sleep apnea at 2:45pm Pt to arrive there at 2:30pm. Left message for pt to call back.

## 2014-12-17 NOTE — Telephone Encounter (Signed)
Pt mailed letter regarding appt.

## 2014-12-20 ENCOUNTER — Encounter: Payer: Self-pay | Admitting: Internal Medicine

## 2015-02-13 ENCOUNTER — Ambulatory Visit (INDEPENDENT_AMBULATORY_CARE_PROVIDER_SITE_OTHER): Payer: BLUE CROSS/BLUE SHIELD | Admitting: Emergency Medicine

## 2015-02-13 VITALS — BP 142/92 | HR 75 | Temp 98.6°F | Resp 16 | Ht 67.5 in | Wt 202.0 lb

## 2015-02-13 DIAGNOSIS — I10 Essential (primary) hypertension: Secondary | ICD-10-CM

## 2015-02-13 DIAGNOSIS — R809 Proteinuria, unspecified: Secondary | ICD-10-CM | POA: Diagnosis not present

## 2015-02-13 LAB — POCT CBC
Granulocyte percent: 65.2 %G (ref 37–80)
HCT, POC: 43.9 % (ref 43.5–53.7)
HEMOGLOBIN: 14.5 g/dL (ref 14.1–18.1)
LYMPH, POC: 1.5 (ref 0.6–3.4)
MCH, POC: 27.4 pg (ref 27–31.2)
MCHC: 33 g/dL (ref 31.8–35.4)
MCV: 83 fL (ref 80–97)
MID (cbc): 0.6 (ref 0–0.9)
MPV: 7.6 fL (ref 0–99.8)
PLATELET COUNT, POC: 253 10*3/uL (ref 142–424)
POC GRANULOCYTE: 3.8 (ref 2–6.9)
POC LYMPH PERCENT: 25 %L (ref 10–50)
POC MID %: 9.8 %M (ref 0–12)
RBC: 5.29 M/uL (ref 4.69–6.13)
RDW, POC: 14.3 %
WBC: 5.9 10*3/uL (ref 4.6–10.2)

## 2015-02-13 LAB — POC MICROSCOPIC URINALYSIS (UMFC): Mucus: ABSENT

## 2015-02-13 LAB — COMPLETE METABOLIC PANEL WITH GFR
ALBUMIN: 4 g/dL (ref 3.6–5.1)
ALK PHOS: 64 U/L (ref 40–115)
ALT: 26 U/L (ref 9–46)
AST: 17 U/L (ref 10–35)
BILIRUBIN TOTAL: 0.6 mg/dL (ref 0.2–1.2)
BUN: 17 mg/dL (ref 7–25)
CALCIUM: 9.9 mg/dL (ref 8.6–10.3)
CO2: 35 mmol/L — ABNORMAL HIGH (ref 20–31)
Chloride: 102 mmol/L (ref 98–110)
Creat: 1.29 mg/dL — ABNORMAL HIGH (ref 0.70–1.25)
GFR, EST AFRICAN AMERICAN: 67 mL/min (ref >=60)
GFR, EST NON AFRICAN AMERICAN: 58 mL/min — AB (ref >=60)
Glucose, Bld: 105 mg/dL — ABNORMAL HIGH (ref 65–99)
POTASSIUM: 4.1 mmol/L (ref 3.5–5.3)
Sodium: 138 mmol/L (ref 135–146)
TOTAL PROTEIN: 7.6 g/dL (ref 6.1–8.1)

## 2015-02-13 LAB — POCT URINALYSIS DIP (MANUAL ENTRY)
BILIRUBIN UA: NEGATIVE
Blood, UA: NEGATIVE
Glucose, UA: NEGATIVE
Ketones, POC UA: NEGATIVE
LEUKOCYTES UA: NEGATIVE
NITRITE UA: NEGATIVE
PH UA: 6
Spec Grav, UA: 1.02
UROBILINOGEN UA: 0.2

## 2015-02-13 NOTE — Progress Notes (Signed)
Patient ID: Tyler Klein, male   DOB: October 11, 1950, 65 y.o.   MRN: [id number]     By signing my name below, I, Zola Button, attest that this documentation has been prepared under the direction and in the presence of Arlyss Queen, MD.  Electronically Signed: Zola Button, Medical Scribe. 02/13/2015. 11:17 AM.   Chief Complaint:  Chief Complaint  Patient presents with  . Other    protein in urine from DOT exam  . Back Pain    HPI: Tyler Klein is a 65 y.o. male who reports to [facility] today complaining of proteinuria. 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 He denies history of kidney problems.  Patient is also complaining of ongoing back pain for the past few years. He states he had CT imaging of his back done within the [facility] system.  Past Medical History  Diagnosis Date  . Hypertension   . HSV-1 (herpes simplex virus 1) infection   . Hyperlipidemia   . Allergy     seasonal   Past Surgical History  Procedure Laterality Date  . Lung surgery  2011    left lung thoracotomy (01/2006)  . Append[age]      age [age]    Social History   Social History  . Marital Status: Married    Spouse Name: N/A  . Number of Children: N/A  . Years of Education: N/A   Social History Main Topics  . Smoking status: Current Some Day Smoker -- 0.50 packs/day    Types: Cigarettes  . Smokeless tobacco: Never Used  . Alcohol Use: No  . Drug Use: No  . Sexual Activity: Not Asked   Other Topics Concern  . None   Social History Narrative   Family History  Problem Relation Age of Onset  . Hypertension Son   . Hypertension Sister   . Hypertension Mother   . Colon cancer Neg Hx   . Colon polyps Neg Hx   . Esophageal cancer Neg Hx   . Prostate cancer Neg Hx   . Rectal cancer Neg Hx   . Stomach cancer Neg Hx    No Known Allergies Prior to Admission medications   Medication Sig Start Date End Date Taking?  Authorizing Provider  amLODipine (NORVASC) 10 MG tablet Take 1 tablet (10 mg total) by mouth daily. 02/24/14 02/24/15 Yes Roselee Culver, MD  atorvastatin (LIPITOR) 20 MG tablet Take 1 tablet (20 mg total) by mouth daily. 02/24/14 02/24/15 Yes Roselee Culver, MD  co-enzyme Q-10 30 MG capsule Take 30 mg by mouth daily.   Yes Historical Provider, MD  lisinopril-hydrochlorothiazide (ZESTORETIC) 20-12.5 MG per tablet Take 1 tablet by mouth daily. 02/24/14 02/24/15 Yes Roselee Culver, MD  Multiple Vitamin (MULTIVITAMIN) tablet Take 1 tablet by mouth daily.   Yes Historical Provider, MD  Naproxen Sodium (ALEVE PO) Take by mouth. As needed   Yes Historical Provider, MD  potassium chloride (KLOR-CON) 20 MEQ packet Take by mouth daily.   Yes Historical Provider, MD  tadalafil (CIALIS) 10 MG tablet Take 1 tablet (10 mg total) by mouth daily as needed for erectile dysfunction. Patient not taking: Reported on 02/13/2015 10/01/14   Hoyt Koch, MD     ROS: The patient denies fevers, chills, night sweats, unintentional weight loss, chest pain, palpitations, wheezing, dyspnea on exertion, nausea, vomiting, abdominal pain, dysuria, hematuria, melena, numbness, weakness, or tingling.  All other systems  have been reviewed and were otherwise negative with the exception of those mentioned in the HPI and as above.    PHYSICAL EXAM: Filed Vitals:   02/13/15 1047  BP: 142/92  Pulse: 75  Temp: 98.6 F (37 C)  Resp: 16   Body mass index is 31.15 kg/(m^2).   General: Alert, no acute distress HEENT:  Normocephalic, atraumatic, oropharynx patent. Eye: Juliette Mangle [facility] Cardiovascular:  Regular rate and rhythm, no rubs murmurs or gallops.  No Carotid bruits, radial pulse intact. No pedal edema.  Respiratory: Clear to auscultation bilaterally.  No wheezes, rales, or rhonchi.  No cyanosis, no use of accessory musculature Abdominal: No organomegaly, abdomen is soft and non-tender, positive bowel sounds.   No masses. Musculoskeletal: Gait intact. No edema, tenderness Skin: No rashes. Neurologic: Facial musculature symmetric. Psychiatric: Patient acts appropriately throughout our interaction. Lymphatic: No cervical or submandibular lymphadenopathy    LABS: Results for orders placed or performed in visit on 02/13/15  POCT CBC  Result Value Ref Range   WBC 5.9 4.6 - 10.2 K/uL   Lymph, poc 1.5 0.6 - 3.4   POC LYMPH PERCENT 25.0 10 - 50 %L   MID (cbc) 0.6 0 - 0.9   POC MID % 9.8 0 - 12 %M   POC Granulocyte 3.8 2 - 6.9   Granulocyte percent 65.2 37 - 80 %G   RBC 5.29 4.69 - 6.13 M/uL   Hemoglobin 14.5 14.1 - 18.1 g/dL   HCT, POC 43.9 43.5 - 53.7 %   MCV 83.0 80 - 97 fL   MCH, POC 27.4 27 - 31.2 pg   MCHC 33.0 31.8 - 35.4 g/dL   RDW, POC 14.3 %   Platelet Count, POC 253 142 - 424 K/uL   MPV 7.6 0 - 99.8 fL  POCT urinalysis dipstick  Result Value Ref Range   Color, UA yellow yellow   Clarity, UA clear clear   Glucose, UA negative negative   Bilirubin, UA negative negative   Ketones, POC UA negative negative   Spec Grav, UA 1.020    Blood, UA negative negative   pH, UA 6.0    Protein Ur, POC =30 (A) negative   Urobilinogen, UA 0.2    Nitrite, UA Negative Negative   Leukocytes, UA Negative Negative  POCT Microscopic Urinalysis ([facility])  Result Value Ref Range   WBC,UR,HPF,POC None None WBC/hpf   RBC,UR,HPF,POC None None RBC/hpf   Bacteria None None, Too numerous to count   Mucus Absent Absent   Epithelial Cells, UR Per Microscopy None None, Too numerous to count cells/hpf     EKG/XRAY:   Primary read interpreted by Dr. Everlene Farrier at [facility]   ASSESSMENT/PLAN: Patient had a urinalysis done at another office which showed 300 mg of protein. Urine done here shows only a trace of protein. To be safe we'll go ahead and order a 24-hour urine collection for protein. No change in blood pressure medications at present. Urine creatinine were done today.I personally performed the services  described in this documentation, which was scribed in my presence. The recorded information has been reviewed and is accurate.    Gross sideeffects, risk and benefits, and alternatives of medications d/w patient. Patient is aware that all medications have potential sideeffects and we are unable to predict every sideeffect or drug-drug interaction that may occur.  Arlyss Queen MD 02/13/2015 11:17 AM

## 2015-02-13 NOTE — Patient Instructions (Signed)
Proteinuria  Proteinuria is a condition in which urine contains more protein than is normal. Proteinuria is either a sign that your body is producing too much protein or a sign that there is a problem with the kidneys. Healthy kidneys prevent most substances that the body needs, including proteins, from leaving the bloodstream and ending up in urine.  CAUSES   Proteinuria may be caused by a temporary event or condition such as stress, exercise, or fever, and go away on its own. Proteinuria may also be a symptom of a more serious condition or disease. Causes of proteinuria include:  · A kidney disease caused by:    Diabetes.    High blood pressure (hypertension).      A disease that affects the immune system, such as lupus.    A genetic disease, such as Alport's syndrome.    Medicines that damage the kidneys, such as long-term nonsteroidal anti-inflammatory drugs (NSAIDs).    Poisoning or exposure to toxic substances.    A reoccurring kidney or urinary infection.  · Excess protein production in the body caused by:    Multiple myeloma.    Amyloidosis.  SYMPTOMS  You may have proteinuria without having noticeable symptoms. If there is a large amount of protein in your urine, your urine may look foamy. You may also notice swelling (edema) in your hands, feet, abdomen, or face.  DIAGNOSIS  To determine whether you have proteinuria, you will need to provide a urine sample. Your urine will then be tested for too much protein and the main blood protein albumin.  If your test shows that you have proteinuria, you may need to take additional tests to determine its cause, how much protein is in your urine, and what type of protein is being lost. Tests may include:  · Blood tests.  · Urine tests.  · A blood pressure measurement.  · Imaging tests.  TREATMENT   Treatment will depend on the cause of your proteinuria. Your caregiver will discuss treatment options with you after you have been diagnosed. If your proteinuria is mild or  temporary, no treatment may be necessary.  HOME CARE INSTRUCTIONS  Ask your caregiver if monitoring the level of protein in your urine at home using simple testing strips is appropriate for you. Early detection of proteinuria can lead to early and often successful treatment of the condition causing it.     This information is not intended to replace advice given to you by your health care provider. Make sure you discuss any questions you have with your health care provider.     Document Released: 02/14/2005 Document Revised: 09/19/2011 Document Reviewed: 05/25/2011  Elsevier Interactive Patient Education ©2016 [employer] Inc[employer]

## 2015-02-15 ENCOUNTER — Telehealth: Payer: Self-pay

## 2015-02-15 LAB — PROTEIN ELECTROPHORESIS, SERUM
ALPHA-1-GLOBULIN: 0.4 g/dL — AB (ref 0.2–0.3)
ALPHA-2-GLOBULIN: 0.6 g/dL (ref 0.5–0.9)
Albumin ELP: 3.5 g/dL — ABNORMAL LOW (ref 3.8–4.8)
Beta 2: 0.4 g/dL (ref 0.2–0.5)
Beta Globulin: 0.4 g/dL (ref 0.4–0.6)
GAMMA GLOBULIN: 1.7 g/dL (ref 0.8–1.7)
TOTAL PROTEIN, SERUM ELECTROPHOR: 6.9 g/dL (ref 6.1–8.1)

## 2015-02-15 NOTE — Telephone Encounter (Signed)
This does not fit under FMLA. 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 This does not fall under FMLA.

## 2015-02-15 NOTE — Telephone Encounter (Signed)
Patient brought in FMLA forms to be completed by Dr Everlene Farrier, I filled out what I could from the [facility] notes from 02/15/15 where he was seen for protein in his urine, not sure that this is a condition covered under FMLA but let the patient know we would fill out paperwork and call him when it is ready. Can you please fill out the highlighted areas and return to the FMLA/Disability box at the [employer] checkout desk with in 5-7 business days. Thank you!

## 2015-02-15 NOTE — Telephone Encounter (Signed)
Dr. Nehemiah Settle would like to come pick up his FMLA.  Could not find it in box.   Call patient when ready

## 2015-02-16 NOTE — Telephone Encounter (Signed)
Patient returned phone call. I explained the situation to him and he just said "ok" and hung up.

## 2015-02-16 NOTE — Telephone Encounter (Signed)
Noted  

## 2015-02-16 NOTE — Telephone Encounter (Signed)
Patient does not qualify for FMLA. I can give him a note for the day he was at the office but protein in his urine does not qualify him for time off under FMLA. His papers are upstairs.

## 2015-02-16 NOTE — Telephone Encounter (Signed)
Attempted to call pt.  VM full; unable to leave message.  

## 2015-02-21 DIAGNOSIS — Z0271 Encounter for disability determination: Secondary | ICD-10-CM

## 2015-03-03 ENCOUNTER — Telehealth: Payer: Self-pay

## 2015-03-03 NOTE — Telephone Encounter (Signed)
Pt called back... Pt req. Call back @ [phone]// MSG marked High priority due to call out  [phone] VM ok

## 2015-03-04 NOTE — Telephone Encounter (Signed)
Returned call and let detailed VM with results.

## 2015-03-17 ENCOUNTER — Ambulatory Visit (INDEPENDENT_AMBULATORY_CARE_PROVIDER_SITE_OTHER): Payer: BLUE CROSS/BLUE SHIELD | Admitting: Physician Assistant

## 2015-03-17 VITALS — BP 150/88 | HR 90 | Temp 98.9°F | Resp 20 | Ht 67.32 in | Wt 199.6 lb

## 2015-03-17 DIAGNOSIS — K0889 Other specified disorders of teeth and supporting structures: Secondary | ICD-10-CM | POA: Diagnosis not present

## 2015-03-17 DIAGNOSIS — Z760 Encounter for issue of repeat prescription: Secondary | ICD-10-CM

## 2015-03-17 DIAGNOSIS — R220 Localized swelling, mass and lump, head: Secondary | ICD-10-CM | POA: Diagnosis not present

## 2015-03-17 MED ORDER — AMLODIPINE BESYLATE 10 MG PO TABS: 10.0000 mg | ORAL_TABLET | Freq: Every day | ORAL | Status: DC

## 2015-03-17 MED ORDER — ATORVASTATIN CALCIUM 20 MG PO TABS: 20.0000 mg | ORAL_TABLET | Freq: Every day | ORAL | Status: DC

## 2015-03-17 MED ORDER — LISINOPRIL-HYDROCHLOROTHIAZIDE 20-12.5 MG PO TABS: 1.0000 | ORAL_TABLET | Freq: Every day | ORAL | Status: DC

## 2015-03-17 MED ORDER — AMOXICILLIN-POT CLAVULANATE 875-125 MG PO TABS: 1.0000 | ORAL_TABLET | Freq: Two times a day (BID) | ORAL | Status: DC

## 2015-03-17 NOTE — Progress Notes (Signed)
03/17/2015 9:33 AM   DOB: 12-26-1950 / MRN: [id number]  SUBJECTIVE:  Tyler Klein is a 65 y.o. male presenting for teeth pain and medication refills.  Reports a tooth broke off about one week ago and he began having teeth pain and worsening facial swelling two days ago.  Reports he is able to eat and drink despite the pain.    He would like refills of his statin and blood pressure medications today.  Last dose of these was 2 days ago and he tolerates these medications without complaint.    He has No Known Allergies.   He  has a past medical history of Hypertension; HSV-1 (herpes simplex virus 1) infection; Hyperlipidemia; and Allergy.    He  reports that he has been smoking Cigarettes.  He has been smoking about 0.50 packs per day. He has never used smokeless tobacco. He reports that he does not drink alcohol or use illicit drugs. He  has no sexual activity history on file. The patient  has past surgical history that includes Lung surgery (2011) and Appendectomy.  His family history includes Hypertension in his mother, sister, and son. There is no history of Colon cancer, Colon polyps, Esophageal cancer, Prostate cancer, Rectal cancer, or Stomach cancer.  Review of Systems  Constitutional: Negative for fever and chills.  Respiratory: Negative for cough.   Cardiovascular: Negative for chest pain and leg swelling.  Gastrointestinal: Negative for nausea.  Skin: Negative for rash.  Neurological: Negative for dizziness and headaches.    Problem list and medications reviewed and updated by myself where necessary, and exist elsewhere in the encounter.   OBJECTIVE:  BP 150/88 mmHg  Pulse 90  Temp(Src) 98.9 F (37.2 C) (Oral)  Resp 20  Ht 5' 7.32" (1.71 m)  Wt 199 lb 9.6 oz (90.538 kg)  BMI 30.96 kg/m2  SpO2 98%  Physical Exam  Constitutional: He is oriented to person, place, and time.  HENT:  Head:    Mouth/Throat:    Cardiovascular: Normal rate and regular rhythm.     Pulmonary/Chest: Effort normal and breath sounds normal.  Musculoskeletal: Normal range of motion.  Neurological: He is alert and oriented to person, place, and time. No cranial nerve deficit.  Skin: Skin is warm and dry.    No results found for this or any previous visit (from the past 72 hour(s)).  No results found.  Lab Results  Component Value Date   CREATININE 1.29* 02/13/2015     ASSESSMENT AND PLAN  Courtenay was seen today for dental pain and medication refill.  Diagnoses and all orders for this visit:  Pain in a tooth or teeth -     amoxicillin-clavulanate (AUGMENTIN) 875-125 MG tablet; Take 1 tablet by mouth 2 (two) times daily. Please see at dentist after 5 days of taking this medication. Do not miss doses.  Facial swelling -     amoxicillin-clavulanate (AUGMENTIN) 875-125 MG tablet; Take 1 tablet by mouth 2 (two) times daily. Please see at dentist after 5 days of taking this medication. Do not miss doses.  Medication refill -     amLODipine (NORVASC) 10 MG tablet; Take 1 tablet (10 mg total) by mouth daily. -     lisinopril-hydrochlorothiazide (ZESTORETIC) 20-12.5 MG tablet; Take 1 tablet by mouth daily. -     atorvastatin (LIPITOR) 20 MG tablet; Take 1 tablet (20 mg total) by mouth daily.    The patient was advised to call or return to clinic if he does  not see an improvement in symptoms or to seek the care of the closest emergency department if he worsens with the above plan.   Philis Fendt, MHS, PA-C [facility] 03/17/2015 9:33 AM

## 2015-03-28 ENCOUNTER — Other Ambulatory Visit: Payer: Self-pay

## 2015-03-28 DIAGNOSIS — Z760 Encounter for issue of repeat prescription: Secondary | ICD-10-CM

## 2015-03-28 MED ORDER — ATORVASTATIN CALCIUM 20 MG PO TABS
20.0000 mg | ORAL_TABLET | Freq: Every day | ORAL | Status: DC
Start: 2015-03-28 — End: 2015-12-07

## 2015-03-29 ENCOUNTER — Institutional Professional Consult (permissible substitution): Payer: BLUE CROSS/BLUE SHIELD | Admitting: Pulmonary Disease

## 2015-09-28 ENCOUNTER — Encounter: Payer: BLUE CROSS/BLUE SHIELD | Admitting: Internal Medicine

## 2015-10-21 ENCOUNTER — Other Ambulatory Visit: Payer: Self-pay | Admitting: Physician Assistant

## 2015-10-21 DIAGNOSIS — Z760 Encounter for issue of repeat prescription: Secondary | ICD-10-CM

## 2015-10-22 NOTE — Telephone Encounter (Signed)
Last ov 03/16/15 given 3 months with 1 refill each Due for an appointment pcp listed as elizabeth crawford?

## 2015-10-23 NOTE — Telephone Encounter (Signed)
Patient called answering service to check status of refills requested.  Currently does not have insurance yet will receive Mediare in 11/2015; refills provided for one month for Amlodipine and Lisinopril/HCTZ.

## 2015-11-26 ENCOUNTER — Ambulatory Visit (INDEPENDENT_AMBULATORY_CARE_PROVIDER_SITE_OTHER): Payer: Medicare Other | Admitting: Family Medicine

## 2015-11-26 VITALS — BP 140/88 | HR 78 | Temp 98.4°F | Resp 17 | Ht 67.32 in | Wt 208.0 lb

## 2015-11-26 DIAGNOSIS — A64 Unspecified sexually transmitted disease: Secondary | ICD-10-CM | POA: Diagnosis not present

## 2015-11-26 DIAGNOSIS — Z125 Encounter for screening for malignant neoplasm of prostate: Secondary | ICD-10-CM | POA: Diagnosis not present

## 2015-11-26 DIAGNOSIS — I10 Essential (primary) hypertension: Secondary | ICD-10-CM | POA: Diagnosis not present

## 2015-11-26 LAB — CBC
HEMATOCRIT: 42.5 % (ref 38.5–50.0)
Hemoglobin: 14.2 g/dL (ref 13.2–17.1)
MCH: 27.7 pg (ref 27.0–33.0)
MCHC: 33.4 g/dL (ref 32.0–36.0)
MCV: 82.8 fL (ref 80.0–100.0)
MPV: 10.2 fL (ref 7.5–12.5)
Platelets: 264 10*3/uL (ref 140–400)
RBC: 5.13 MIL/uL (ref 4.20–5.80)
RDW: 16.1 % — AB (ref 11.0–15.0)
WBC: 7.7 10*3/uL (ref 3.8–10.8)

## 2015-11-26 LAB — LIPID PANEL
CHOL/HDL RATIO: 8.5 ratio — AB (ref <5.0)
Cholesterol: 297 mg/dL — ABNORMAL HIGH (ref <200)
HDL: 35 mg/dL — AB (ref >40)
LDL Cholesterol: 223 mg/dL — ABNORMAL HIGH (ref <100)
TRIGLYCERIDES: 195 mg/dL — AB (ref <150)
VLDL: 39 mg/dL — ABNORMAL HIGH (ref <30)

## 2015-11-26 LAB — COMPREHENSIVE METABOLIC PANEL
ALBUMIN: 4.1 g/dL (ref 3.6–5.1)
ALK PHOS: 57 U/L (ref 40–115)
ALT: 34 U/L (ref 9–46)
AST: 27 U/L (ref 10–35)
BUN: 20 mg/dL (ref 7–25)
CALCIUM: 9.7 mg/dL (ref 8.6–10.3)
CO2: 28 mmol/L (ref 20–31)
Chloride: 104 mmol/L (ref 98–110)
Creat: 1.47 mg/dL — ABNORMAL HIGH (ref 0.70–1.25)
GLUCOSE: 85 mg/dL (ref 65–99)
POTASSIUM: 3.4 mmol/L — AB (ref 3.5–5.3)
Sodium: 141 mmol/L (ref 135–146)
Total Bilirubin: 0.5 mg/dL (ref 0.2–1.2)
Total Protein: 7.8 g/dL (ref 6.1–8.1)

## 2015-11-26 LAB — HIV ANTIBODY (ROUTINE TESTING W REFLEX): HIV 1&2 Ab, 4th Generation: NONREACTIVE

## 2015-11-26 LAB — PSA: PSA: 3.1 ng/mL (ref <=4.0)

## 2015-11-26 NOTE — Patient Instructions (Addendum)
  It was good to meet you today!  I have refilled your blood pressure medications.  We are checking labs and your cholesterol.  We are also checking your prostate.  Have a good Thanksgiving   IF you received an x-ray today, you will receive an invoice from [facility]. Please contact [facility] at [phone] with questions or concerns regarding your invoice.   IF you received labwork today, you will receive an invoice from [employer]. Please contact [facility] at [phone] with questions or concerns regarding your invoice.   Our billing staff will not be able to assist you with questions regarding bills from these companies.  You will be contacted with the lab results as soon as they are available. The fastest way to get your results is to activate your My Chart account. Instructions are located on the last page of this paperwork. If you have not heard from [place] regarding the results in 2 weeks, please contact this office.

## 2015-11-26 NOTE — Progress Notes (Signed)
Tyler Klein is a 65 y.o. male who presents to [facility] today for HTN and HLD:  1.   Hypertension:  Long-term problem for this patient.  No adverse effects from medication.  Not checking it regularly.  No HA, CP, dizziness, shortness of breath, palpitations, or LE swelling.  Has been taking his medications regularly.   BP Readings from Last 3 Encounters:  11/26/15 140/88  03/17/15 (!) 150/88  02/13/15 (!) 142/92   2.   HLD:  Last lipid panel listed below.    Currently is only intermittently on Statin due to finances.  Denies any myalgias, icterus, jaundice.  Tolerating medications well.   Lab Results  Component Value Date   CHOL 201 (H) 09/28/2014   CHOL 197 02/24/2014   CHOL 178 02/16/2013   Lab Results  Component Value Date   HDL 30.90 (L) 09/28/2014   HDL 37 (L) 02/24/2014   HDL 36 (L) 02/16/2013   Lab Results  Component Value Date   LDLCALC 129 (H) 02/24/2014   LDLCALC 115 (H) 02/16/2013   LDLCALC 230 (H) 01/23/2012   Lab Results  Component Value Date   TRIG 232.0 (H) 09/28/2014   TRIG 156 (H) 02/24/2014   TRIG 135 02/16/2013   Lab Results  Component Value Date   CHOLHDL 7 09/28/2014   CHOLHDL 5.3 02/24/2014   CHOLHDL 4.9 02/16/2013     ROS as above.  Pertinently, no chest pain, palpitations, SOB, Fever, Chills, Abd pain, N/V/D.   PMH reviewed. Patient is a nonsmoker.   Past Medical History:  Diagnosis Date  . Allergy    seasonal  . HSV-1 (herpes simplex virus 1) infection   . Hyperlipidemia   . Hypertension    Past Surgical History:  Procedure Laterality Date  . APPENDECTOMY     age 11-10   . LUNG SURGERY  2011   left lung thoracotomy (01/2006)    Medications reviewed. Current Outpatient Prescriptions  Medication Sig Dispense Refill  . amLODipine (NORVASC) 10 MG tablet TAKE ONE TABLET BY MOUTH ONCE DAILY 30 tablet 0  . atorvastatin (LIPITOR) 20 MG tablet Take 1 tablet (20 mg total) by mouth daily. 90 tablet 1  .  co-enzyme Q-10 30 MG capsule Take 30 mg by mouth daily. Reported on 03/17/2015    . lisinopril-hydrochlorothiazide (PRINZIDE,ZESTORETIC) 20-12.5 MG tablet TAKE ONE TABLET BY MOUTH ONCE DAILY 30 tablet 0  . Naproxen Sodium (ALEVE PO) Take by mouth. As needed    . Multiple Vitamin (MULTIVITAMIN) tablet Take 1 tablet by mouth daily.    . potassium chloride (KLOR-CON) 20 MEQ packet Take by mouth daily.    . tadalafil (CIALIS) 10 MG tablet Take 1 tablet (10 mg total) by mouth daily as needed for erectile dysfunction. (Patient not taking: Reported on 11/26/2015) 10 tablet 0   No current facility-administered medications for this visit.      Physical Exam:  BP 140/88 (BP Location: Right Arm, Patient Position: Sitting, Cuff Size: Large)   Pulse 78   Temp 98.4 F (36.9 C) (Oral)   Resp 17   Ht 5' 7.32" (1.71 m)   Wt 208 lb (94.3 kg)   SpO2 99%   BMI 32.27 kg/m  Gen:  Alert, cooperative patient who appears stated age in no acute distress.  Vital signs reviewed. HEENT: EOMI,  MMM Pulm:  Clear to auscultation bilaterally with good air movement.  No wheezes or rales noted.   Cardiac:  Regular rate and rhythm  without murmur auscultated. Abd:  Soft/nondistended/nontender.  Good bowel sounds throughout all four quadrants.  No masses noted.  Exts: Non edematous BL  LE, warm and well perfused.  Neuro:  Alert and oriented x 4  Assessment and Plan:  1.  HTN: - Angle today. -Checking labs. -I have refilled his medications today.  #2. Hyperlipidemia: -Checking lipid panel today. -I refilled his pravastatin. He states he now has Medicare but does not have Medicare part D. Awaiting results of lipid panel.  #3. HIV screening: -He is concerned he may have had contact with the medial disease at some point in the past couple years. He would like to be screened for HIV. He has no dysuria.  #4. PSA: -No symptoms of LUTS. Checking PSA today based on patient request.

## 2015-11-27 ENCOUNTER — Other Ambulatory Visit: Payer: Self-pay | Admitting: Family Medicine

## 2015-11-27 DIAGNOSIS — Z760 Encounter for issue of repeat prescription: Secondary | ICD-10-CM

## 2015-12-07 ENCOUNTER — Other Ambulatory Visit: Payer: Self-pay

## 2015-12-07 DIAGNOSIS — Z760 Encounter for issue of repeat prescription: Secondary | ICD-10-CM

## 2015-12-07 MED ORDER — ATORVASTATIN CALCIUM 20 MG PO TABS: 20.0000 mg | ORAL_TABLET | Freq: Every day | ORAL | 1 refills | Status: DC

## 2016-05-17 ENCOUNTER — Encounter: Payer: Self-pay | Admitting: Family Medicine

## 2016-05-17 ENCOUNTER — Ambulatory Visit (INDEPENDENT_AMBULATORY_CARE_PROVIDER_SITE_OTHER): Payer: Medicare HMO | Admitting: Family Medicine

## 2016-05-17 VITALS — BP 118/75 | HR 77 | Temp 98.3°F | Resp 16 | Ht 66.5 in | Wt 210.2 lb

## 2016-05-17 DIAGNOSIS — I10 Essential (primary) hypertension: Secondary | ICD-10-CM | POA: Diagnosis not present

## 2016-05-17 DIAGNOSIS — E785 Hyperlipidemia, unspecified: Secondary | ICD-10-CM

## 2016-05-17 DIAGNOSIS — R809 Proteinuria, unspecified: Secondary | ICD-10-CM | POA: Diagnosis not present

## 2016-05-17 DIAGNOSIS — F17200 Nicotine dependence, unspecified, uncomplicated: Secondary | ICD-10-CM

## 2016-05-17 DIAGNOSIS — R7989 Other specified abnormal findings of blood chemistry: Secondary | ICD-10-CM

## 2016-05-17 DIAGNOSIS — Z760 Encounter for issue of repeat prescription: Secondary | ICD-10-CM

## 2016-05-17 LAB — POCT URINALYSIS DIP (MANUAL ENTRY)
Bilirubin, UA: NEGATIVE
Blood, UA: NEGATIVE
GLUCOSE UA: NEGATIVE mg/dL
Ketones, POC UA: NEGATIVE mg/dL
Leukocytes, UA: NEGATIVE
Nitrite, UA: NEGATIVE
Protein Ur, POC: 100 mg/dL — AB
SPEC GRAV UA: 1.025 (ref 1.010–1.025)
Urobilinogen, UA: 0.2 E.U./dL
pH, UA: 6 (ref 5.0–8.0)

## 2016-05-17 MED ORDER — AMLODIPINE BESYLATE 10 MG PO TABS: 10.0000 mg | ORAL_TABLET | Freq: Every day | ORAL | 1 refills | Status: DC

## 2016-05-17 MED ORDER — LISINOPRIL-HYDROCHLOROTHIAZIDE 20-12.5 MG PO TABS: 1.0000 | ORAL_TABLET | Freq: Every day | ORAL | 1 refills | Status: DC

## 2016-05-17 MED ORDER — ATORVASTATIN CALCIUM 20 MG PO TABS: 20.0000 mg | ORAL_TABLET | Freq: Every day | ORAL | 1 refills | Status: DC

## 2016-05-17 NOTE — Progress Notes (Signed)
Subjective:  This chart was scribed for Wendie Agreste, MD by Tamsen Roers, at [place] at [facility].  This patient was seen in room 24 and the patient's care was started at 12:07 PM.   Chief Complaint  Patient presents with  . Hypertension     Patient ID: Tyler Klein, male    DOB: [birth year], 66 y.o.   MRN: [id number]  HPI HPI Comments: [name] is a 66 y.o. male who presents to Primary Care at [facility] for medical refills.  He is a new patient to me, previously seen by other providers in our office. Last office visit for hypertension with Dr. Mingo Amber in November. He takes Norvasc 10 mg QD, Lisinopril HCTZ 20-12.5 mg QD.  ---- Patient is compliant with his medication and denies any side effects from them.   Patient takes "potassium glucose" pills occasionally and tries to eat a banana daily. Patient is aware that he does not drink enough water.   Patient does not have a family history of heart disease.  He had a stress test completed " a long time ago" but would like one to see his status.    BP Readings from Last 3 Encounters:  05/17/16 118/75  11/26/15 140/88  03/17/15 (!) 150/88   Lab Results  Component Value Date   CREATININE 1.47 (H) 11/26/2015     Elevated creatinine/chronic kidney disease: creatinine in 2016 and last year : 1.23-1.29,  Slightly higher at 1.47 in November. ----- Patient takes Advil/Aleve about once per month.     Hyperlipidemia: He was advised to restart Lipitor 20 mg QD after most recent blood work due to significant elevations. He was intermittently taking Statin due to cost at last visit.  ----- Patient denies any new side effects with his cholesterol medication (Atorvastatin) and states that he is now compliant with it.  Lab Results  Component Value Date   CHOL 297 (H) 11/26/2015   HDL 35 (L) 11/26/2015   LDLCALC 223 (H) 11/26/2015   LDLDIRECT 135.0 09/28/2014   TRIG 195 (H) 11/26/2015   CHOLHDL 8.5 (H) 11/26/2015   Lab Results   Component Value Date   ALT 34 11/26/2015   AST 27 11/26/2015   ALKPHOS 57 11/26/2015   BILITOT 0.5 11/26/2015     Proteinuria: 30 of protein on a urine dipstick Feb 2017. Otherwise normal urine. No hematuria. SPEP was ordered without concerns. Plan for 24 hour urine but has not been collected. 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     Tobacco abuse: half pack per day-----  Patient states that he smokes about 6-7 cigarettes per day.  He has stopped smoking in the past without any particular methods.  He states that he just "smokes to smoke".  He feels like he can stop whenever he wants but every time he stops, he gains weight (and he has been trying to lose weight).     Patient is retired ([employer])  but does work part time occasionally.    Patient Active Problem List   Diagnosis Date Noted  . Proteinuria 02/13/2015  . Snoring 02/18/2013  . HEMATOCHEZIA 03/24/2008  . Hyperlipidemia 02/04/2006  . HYPERCALCEMIA 02/04/2006  . ANEMIA-NOS 02/04/2006  . ERECTILE DYSFUNCTION 02/04/2006  . TOBACCO ABUSE 02/04/2006  . Essential hypertension 02/04/2006  . PULMONARY DISEASE 02/04/2006  . RENAL INSUFFICIENCY, CHRONIC 02/04/2006   Past Medical History:  Diagnosis Date  . Allergy    seasonal  . HSV-1 (herpes simplex virus 1) infection   . Hyperlipidemia   . Hypertension    Past Surgical History:  Procedure Laterality Date  . APPENDECTOMY     age 68-10   . LUNG SURGERY  2011   left lung thoracotomy (01/2006)   No Known Allergies Prior to Admission medications   Medication Sig Start Date End Date Taking? Authorizing Provider  amLODipine (NORVASC) 10 MG tablet TAKE ONE TABLET BY MOUTH ONCE DAILY 11/28/15  Yes Alveda Reasons, MD  atorvastatin (LIPITOR) 20 MG tablet Take 1 tablet (20 mg total) by mouth daily. 12/07/15 12/06/16 Yes Alveda Reasons, MD  co-enzyme Q-10 30 MG capsule Take 30  mg by mouth daily. Reported on 03/17/2015   Yes [provider]  lisinopril-hydrochlorothiazide (PRINZIDE,ZESTORETIC) 20-12.5 MG tablet TAKE ONE TABLET BY MOUTH ONCE DAILY 11/28/15  Yes Alveda Reasons, MD  Multiple Vitamin (MULTIVITAMIN) tablet Take 1 tablet by mouth daily.   Yes [provider]  Naproxen Sodium (ALEVE PO) Take by mouth. As needed   Yes [provider]  potassium chloride (KLOR-CON) 20 MEQ packet Take by mouth daily.   Yes [provider]  tadalafil (CIALIS) 10 MG tablet Take 1 tablet (10 mg total) by mouth daily as needed for erectile dysfunction. Patient not taking: Reported on 11/26/2015 10/01/14   Hoyt Koch, MD   Social History   Social History  . Marital status: Married    Spouse name: N/A  . Number of children: N/A  . Years of education: N/A   Occupational History  . Not on file.   Social History Main Topics  . Smoking status: Current Some Day Smoker    Packs/day: 0.50    Types: Cigarettes  . Smokeless tobacco: Never Used  . Alcohol use No  . Drug use: No  . Sexual activity: Not on file   Other Topics Concern  . Not on file   Social History Narrative  . No narrative on file      Review of Systems  Constitutional: Negative for chills and fever.  Eyes: Negative for pain and redness.  Respiratory: Negative for cough and choking.   Gastrointestinal: Negative for nausea and vomiting.  Neurological: Negative for syncope and speech difficulty.       Objective:   Physical Exam  Constitutional: He is oriented to person, place, and time. He appears well-developed and well-nourished. No distress.  HENT:  Head: Normocephalic and atraumatic.  Eyes: EOM are normal. Pupils are equal, round, and reactive to light.  Neck: No JVD present. Carotid bruit is not present.  Cardiovascular: Normal rate, regular rhythm and normal heart sounds.   No murmur heard. Pulmonary/Chest: Effort normal and breath sounds  normal. No respiratory distress. He has no rales.  Abdominal: There is no tenderness.  Musculoskeletal: He exhibits no edema.  Neurological: He is alert and oriented to person, place, and time.  Skin: Skin is warm and dry.  Psychiatric: He has a normal mood and affect.  Vitals reviewed.   Vitals:   05/17/16 1119  BP: 118/75  Pulse: 77  Resp: 16  Temp: 98.3 F (36.8 C)  TempSrc: Oral  SpO2: 97%  Weight: 210 lb 3.2 oz (95.3 kg)  Height: 5' 6.5" (1.689 m)   Results for orders placed or performed in visit on 05/17/16  Comprehensive metabolic panel  Result Value Ref Range   Glucose 103 (H) 65 - 99 mg/dL  BUN 15 8 - 27 mg/dL   Creatinine, Ser 1.43 (H) 0.76 - 1.27 mg/dL   GFR calc non Af Amer 51 (L) >59 mL/min/1.73   GFR calc Af Amer 59 (L) >59 mL/min/1.73   BUN/Creatinine Ratio 10 10 - 24   Sodium 141 134 - 144 mmol/L   Potassium 3.6 3.5 - 5.2 mmol/L   Chloride 99 96 - 106 mmol/L   CO2 28 18 - 29 mmol/L   Calcium 9.6 8.6 - 10.2 mg/dL   Total Protein 8.2 6.0 - 8.5 g/dL   Albumin 4.2 3.6 - 4.8 g/dL   Globulin, Total 4.0 1.5 - 4.5 g/dL   Albumin/Globulin Ratio 1.1 (L) 1.2 - 2.2   Bilirubin Total 0.7 0.0 - 1.2 mg/dL   Alkaline Phosphatase 86 39 - 117 IU/L   AST 29 0 - 40 IU/L   ALT 41 0 - 44 IU/L  Lipid panel  Result Value Ref Range   Cholesterol, Total 190 100 - 199 mg/dL   Triglycerides 361 (H) 0 - 149 mg/dL   HDL 28 (L) >39 mg/dL   VLDL Cholesterol Cal 72 (H) 5 - 40 mg/dL   LDL Calculated 90 0 - 99 mg/dL   Chol/HDL Ratio 6.8 (H) 0.0 - 5.0 ratio  POCT urinalysis dipstick  Result Value Ref Range   Color, UA yellow yellow   Clarity, UA clear clear   Glucose, UA negative negative mg/dL   Bilirubin, UA negative negative   Ketones, POC UA negative negative mg/dL   Spec Grav, UA 1.025 1.010 - 1.025   Blood, UA negative negative   pH, UA 6.0 5.0 - 8.0   Protein Ur, POC =100 (A) negative mg/dL   Urobilinogen, UA 0.2 0.2 or 1.0 E.U./dL   Nitrite, UA Negative Negative    Leukocytes, UA Negative Negative         Assessment & Plan:    GAVAN NORDBY is a 66 y.o. male Essential hypertension - Plan: Comprehensive metabolic panel, Ambulatory referral to Cardiology  - Blood pressure stable, refer to cardiology to discuss stress testing as multiple cardiac risk factors. No change in medication at this time as tolerated without new side effects.  Elevated serum creatinine - Plan: Comprehensive metabolic panel Proteinuria, unspecified type - Plan: POCT urinalysis dipstick  -Repeat testing as above. Consider nephrology eval  Hyperlipidemia, unspecified hyperlipidemia type - Plan: Lipid panel, Ambulatory referral to Cardiology  -Check lipids as part of risk stratification, cardiology eval as above. Continue Lipitor same dose for now.  Tobacco use disorder - Plan: Ambulatory referral to Cardiology  -Importance of cessation discussed including with risk of cardiac disease. Potential resources discussed and advised let me know if he needs help.     Meds ordered this encounter  Medications  . amLODipine (NORVASC) 10 MG tablet    Sig: Take 1 tablet (10 mg total) by mouth daily.    Dispense:  90 tablet    Refill:  1  . atorvastatin (LIPITOR) 20 MG tablet    Sig: Take 1 tablet (20 mg total) by mouth daily.    Dispense:  90 tablet    Refill:  1  . lisinopril-hydrochlorothiazide (PRINZIDE,ZESTORETIC) 20-12.5 MG tablet    Sig: Take 1 tablet by mouth daily.    Dispense:  90 tablet    Refill:  1   Patient Instructions   For kidney function and protein in urine, I will repeat that testing today. Avoid NSAIDS like Advil or Alleve.  Tylenol if needed  would be safer. Make sure you drink plenty of fluids - water is best.  If testing remains abnormal, I can send you to a nephrologist for further discussion of these labs.  Blood pressure is stable. No change in medications for now.  I will repeat her cholesterol tests today, but if still elevated may need higher  dose of Lipitor. I will let you know.  Quitting smoking may be one of the most beneficial changes for your health. See information below, I no if you have difficulty quitting smoking.  I will refer you to a cardiologist to discuss stress testing.     Steps to Quit Smoking Smoking tobacco can be harmful to your health and can affect almost every organ in your body. Smoking puts you, and those around you, at risk for developing many serious chronic diseases. Quitting smoking is difficult, but it is one of the best things that you can do for your health. It is never too late to quit. What are the benefits of quitting smoking? When you quit smoking, you lower your risk of developing serious diseases and conditions, such as:  Lung cancer or lung disease, such as COPD.  Heart disease.  Stroke.  Heart attack.  Infertility.  Osteoporosis and bone fractures. Additionally, symptoms such as coughing, wheezing, and shortness of breath may get better when you quit. You may also find that you get sick less often because your body is stronger at fighting off colds and infections. If you are pregnant, quitting smoking can help to reduce your chances of having a baby of low birth weight. How do I get ready to quit? When you decide to quit smoking, create a plan to make sure that you are successful. Before you quit:  Pick a date to quit. Set a date within the next two weeks to give you time to prepare.  Write down the reasons why you are quitting. Keep this list in places where you will see it often, such as on your bathroom mirror or in your car or wallet.  Identify the people, places, things, and activities that make you want to smoke (triggers) and avoid them. Make sure to take these actions:  Throw away all cigarettes at home, at work, and in your car.  Throw away smoking accessories, such as Scientist, research (medical).  Clean your car and make sure to empty the ashtray.  Clean your home,  including curtains and carpets.  Tell your family, friends, and coworkers that you are quitting. Support from your loved ones can make quitting easier.  Talk with your health care provider about your options for quitting smoking.  Find out what treatment options are covered by your health insurance. What strategies can I use to quit smoking? Talk with your healthcare provider about different strategies to quit smoking. Some strategies include:  Quitting smoking altogether instead of gradually lessening how much you smoke over a period of time. Research shows that quitting "cold [place]" is more successful than gradually quitting.  Attending in-person counseling to help you build problem-solving skills. You are more likely to have success in quitting if you attend several counseling sessions. Even short sessions of 10 minutes can be effective.  Finding resources and support systems that can help you to quit smoking and remain smoke-free after you quit. These resources are most helpful when you use them often. They can include:  Online chats with a Social worker.  Telephone quitlines.  Printed Furniture conservator/restorer.  Support groups or group counseling.  Text messaging programs.  Mobile phone applications.  Taking medicines to help you quit smoking. (If you are pregnant or breastfeeding, talk with your health care provider first.) Some medicines contain nicotine and some do not. Both types of medicines help with cravings, but the medicines that include nicotine help to relieve withdrawal symptoms. Your health care provider may recommend:  Nicotine patches, gum, or lozenges.  Nicotine inhalers or sprays.  Non-nicotine medicine that is taken by mouth. Talk with your health care provider about combining strategies, such as taking medicines while you are also receiving in-person counseling. Using these two strategies together makes you more likely to succeed in quitting than if you used either  strategy on its own. If you are pregnant or breastfeeding, talk with your health care provider about finding counseling or other support strategies to quit smoking. Do not take medicine to help you quit smoking unless told to do so by your health care provider. What things can I do to make it easier to quit? Quitting smoking might feel overwhelming at first, but there is a lot that you can do to make it easier. Take these important actions:  Reach out to your family and friends and ask that they support and encourage you during this time. Call telephone quitlines, reach out to support groups, or work with a counselor for support.  Ask people who smoke to avoid smoking around you.  Avoid places that trigger you to smoke, such as bars, parties, or smoke-break areas at work.  Spend time around people who do not smoke.  Lessen stress in your life, because stress can be a smoking trigger for some people. To lessen stress, try:  Exercising regularly.  Deep-breathing exercises.  Yoga.  Meditating.  Performing a body scan. This involves closing your eyes, scanning your body from head to toe, and noticing which parts of your body are particularly tense. Purposefully relax the muscles in those areas.  Download or purchase mobile phone or tablet apps (applications) that can help you stick to your quit plan by providing reminders, tips, and encouragement. There are many free apps, such as QuitGuide from the [employer] Office manager for Disease Control and Prevention). You can find other support for quitting smoking (smoking cessation) through smokefree.gov and other websites. How will I feel when I quit smoking? Within the first 24 hours of quitting smoking, you may start to feel some withdrawal symptoms. These symptoms are usually most noticeable 2-3 days after quitting, but they usually do not last beyond 2-3 weeks. Changes or symptoms that you might experience include:  Mood swings.  Restlessness, anxiety,  or irritation.  Difficulty concentrating.  Dizziness.  Strong cravings for sugary foods in addition to nicotine.  Mild weight gain.  Constipation.  Nausea.  Coughing or a sore throat.  Changes in how your medicines work in your body.  A depressed mood.  Difficulty sleeping (insomnia). After the first 2-3 weeks of quitting, you may start to notice more positive results, such as:  Improved sense of smell and taste.  Decreased coughing and sore throat.  Slower heart rate.  Lower blood pressure.  Clearer skin.  The ability to breathe more easily.  Fewer sick days. Quitting smoking is very challenging for most people. Do not get discouraged if you are not successful the first time. Some people need to make many attempts to quit before they achieve long-term success. Do your best to stick to your quit plan, and talk with your health care provider if you  have any questions or concerns. This information is not intended to replace advice given to you by your health care provider. Make sure you discuss any questions you have with your health care provider. Document Released: 12/19/2000 Document Revised: 08/23/2015 Document Reviewed: 05/11/2014 Elsevier Interactive Patient Education  2017 [employer].     IF you received an x-ray today, you will receive an invoice from [facility]. Please contact [facility] at [phone] with questions or concerns regarding your invoice.   IF you received labwork today, you will receive an invoice from [facility]. Please contact [facility] at [phone] with questions or concerns regarding your invoice.   Our billing staff will not be able to assist you with questions regarding bills from these companies.  You will be contacted with the lab results as soon as they are available. The fastest way to get your results is to activate your My Chart account. Instructions are located on the last page of this paperwork. If you have  not heard from [place] regarding the results in 2 weeks, please contact this office.      I personally performed the services described in this documentation, which was scribed in my presence. The recorded information has been reviewed and considered for accuracy and completeness, addended by me as needed, and agree with information above.  Signed,   Merri Ray, MD [facility] at [place].  05/19/16 5:02 PM

## 2016-05-17 NOTE — Patient Instructions (Addendum)
For kidney function and protein in urine, I will repeat that testing today. Avoid NSAIDS like Advil or Alleve.  Tylenol if needed would be safer. Make sure you drink plenty of fluids - water is best.  If testing remains abnormal, I can send you to a nephrologist for further discussion of these labs.  Blood pressure is stable. No change in medications for now.  I will repeat her cholesterol tests today, but if still elevated may need higher dose of Lipitor. I will let you know.  Quitting smoking may be one of the most beneficial changes for your health. See information below, I no if you have difficulty quitting smoking.  I will refer you to a cardiologist to discuss stress testing.     Steps to Quit Smoking Smoking tobacco can be harmful to your health and can affect almost every organ in your body. Smoking puts you, and those around you, at risk for developing many serious chronic diseases. Quitting smoking is difficult, but it is one of the best things that you can do for your health. It is never too late to quit. What are the benefits of quitting smoking? When you quit smoking, you lower your risk of developing serious diseases and conditions, such as:  Lung cancer or lung disease, such as COPD.  Heart disease.  Stroke.  Heart attack.  Infertility.  Osteoporosis and bone fractures. Additionally, symptoms such as coughing, wheezing, and shortness of breath may get better when you quit. You may also find that you get sick less often because your body is stronger at fighting off colds and infections. If you are pregnant, quitting smoking can help to reduce your chances of having a baby of low birth weight. How do I get ready to quit? When you decide to quit smoking, create a plan to make sure that you are successful. Before you quit:  Pick a date to quit. Set a date within the next two weeks to give you time to prepare.  Write down the reasons why you are quitting. Keep this list  in places where you will see it often, such as on your bathroom mirror or in your car or wallet.  Identify the people, places, things, and activities that make you want to smoke (triggers) and avoid them. Make sure to take these actions:  Throw away all cigarettes at home, at work, and in your car.  Throw away smoking accessories, such as Scientist, research (medical).  Clean your car and make sure to empty the ashtray.  Clean your home, including curtains and carpets.  Tell your family, friends, and coworkers that you are quitting. Support from your loved ones can make quitting easier.  Talk with your health care provider about your options for quitting smoking.  Find out what treatment options are covered by your health insurance. What strategies can I use to quit smoking? Talk with your healthcare provider about different strategies to quit smoking. Some strategies include:  Quitting smoking altogether instead of gradually lessening how much you smoke over a period of time. Research shows that quitting "cold [place]" is more successful than gradually quitting.  Attending in-person counseling to help you build problem-solving skills. You are more likely to have success in quitting if you attend several counseling sessions. Even short sessions of 10 minutes can be effective.  Finding resources and support systems that can help you to quit smoking and remain smoke-free after you quit. These resources are most helpful when you use them often.  They can include:  Online chats with a Social worker.  Telephone quitlines.  Printed Furniture conservator/restorer.  Support groups or group counseling.  Text messaging programs.  Mobile phone applications.  Taking medicines to help you quit smoking. (If you are pregnant or breastfeeding, talk with your health care provider first.) Some medicines contain nicotine and some do not. Both types of medicines help with cravings, but the medicines that include nicotine  help to relieve withdrawal symptoms. Your health care provider may recommend:  Nicotine patches, gum, or lozenges.  Nicotine inhalers or sprays.  Non-nicotine medicine that is taken by mouth. Talk with your health care provider about combining strategies, such as taking medicines while you are also receiving in-person counseling. Using these two strategies together makes you more likely to succeed in quitting than if you used either strategy on its own. If you are pregnant or breastfeeding, talk with your health care provider about finding counseling or other support strategies to quit smoking. Do not take medicine to help you quit smoking unless told to do so by your health care provider. What things can I do to make it easier to quit? Quitting smoking might feel overwhelming at first, but there is a lot that you can do to make it easier. Take these important actions:  Reach out to your family and friends and ask that they support and encourage you during this time. Call telephone quitlines, reach out to support groups, or work with a counselor for support.  Ask people who smoke to avoid smoking around you.  Avoid places that trigger you to smoke, such as bars, parties, or smoke-break areas at work.  Spend time around people who do not smoke.  Lessen stress in your life, because stress can be a smoking trigger for some people. To lessen stress, try:  Exercising regularly.  Deep-breathing exercises.  Yoga.  Meditating.  Performing a body scan. This involves closing your eyes, scanning your body from head to toe, and noticing which parts of your body are particularly tense. Purposefully relax the muscles in those areas.  Download or purchase mobile phone or tablet apps (applications) that can help you stick to your quit plan by providing reminders, tips, and encouragement. There are many free apps, such as QuitGuide from the [employer] Office manager for Disease Control and Prevention). You can  find other support for quitting smoking (smoking cessation) through smokefree.gov and other websites. How will I feel when I quit smoking? Within the first 24 hours of quitting smoking, you may start to feel some withdrawal symptoms. These symptoms are usually most noticeable 2-3 days after quitting, but they usually do not last beyond 2-3 weeks. Changes or symptoms that you might experience include:  Mood swings.  Restlessness, anxiety, or irritation.  Difficulty concentrating.  Dizziness.  Strong cravings for sugary foods in addition to nicotine.  Mild weight gain.  Constipation.  Nausea.  Coughing or a sore throat.  Changes in how your medicines work in your body.  A depressed mood.  Difficulty sleeping (insomnia). After the first 2-3 weeks of quitting, you may start to notice more positive results, such as:  Improved sense of smell and taste.  Decreased coughing and sore throat.  Slower heart rate.  Lower blood pressure.  Clearer skin.  The ability to breathe more easily.  Fewer sick days. Quitting smoking is very challenging for most people. Do not get discouraged if you are not successful the first time. Some people need to make many attempts to quit  before they achieve long-term success. Do your best to stick to your quit plan, and talk with your health care provider if you have any questions or concerns. This information is not intended to replace advice given to you by your health care provider. Make sure you discuss any questions you have with your health care provider. Document Released: 12/19/2000 Document Revised: 08/23/2015 Document Reviewed: 05/11/2014 Elsevier Interactive Patient Education  2017 [employer].     IF you received an x-ray today, you will receive an invoice from [facility]. Please contact [facility] at [phone] with questions or concerns regarding your invoice.   IF you received labwork today, you will receive  an invoice from [place]. Please contact [facility] at [phone] with questions or concerns regarding your invoice.   Our billing staff will not be able to assist you with questions regarding bills from these companies.  You will be contacted with the lab results as soon as they are available. The fastest way to get your results is to activate your My Chart account. Instructions are located on the last page of this paperwork. If you have not heard from [place] regarding the results in 2 weeks, please contact this office.

## 2016-05-18 LAB — COMPREHENSIVE METABOLIC PANEL
A/G RATIO: 1.1 — AB (ref 1.2–2.2)
ALBUMIN: 4.2 g/dL (ref 3.6–4.8)
ALT: 41 IU/L (ref 0–44)
AST: 29 IU/L (ref 0–40)
Alkaline Phosphatase: 86 IU/L (ref 39–117)
BILIRUBIN TOTAL: 0.7 mg/dL (ref 0.0–1.2)
BUN/Creatinine Ratio: 10 (ref 10–24)
BUN: 15 mg/dL (ref 8–27)
CALCIUM: 9.6 mg/dL (ref 8.6–10.2)
CHLORIDE: 99 mmol/L (ref 96–106)
CO2: 28 mmol/L (ref 18–29)
Creatinine, Ser: 1.43 mg/dL — ABNORMAL HIGH (ref 0.76–1.27)
GFR calc Af Amer: 59 mL/min/{1.73_m2} — ABNORMAL LOW (ref >59)
GFR, EST NON AFRICAN AMERICAN: 51 mL/min/{1.73_m2} — AB (ref >59)
Globulin, Total: 4 g/dL (ref 1.5–4.5)
Glucose: 103 mg/dL — ABNORMAL HIGH (ref 65–99)
POTASSIUM: 3.6 mmol/L (ref 3.5–5.2)
Sodium: 141 mmol/L (ref 134–144)
TOTAL PROTEIN: 8.2 g/dL (ref 6.0–8.5)

## 2016-05-18 LAB — LIPID PANEL
CHOL/HDL RATIO: 6.8 ratio — AB (ref 0.0–5.0)
Cholesterol, Total: 190 mg/dL (ref 100–199)
HDL: 28 mg/dL — AB (ref >39)
LDL Calculated: 90 mg/dL (ref 0–99)
Triglycerides: 361 mg/dL — ABNORMAL HIGH (ref 0–149)
VLDL Cholesterol Cal: 72 mg/dL — ABNORMAL HIGH (ref 5–40)

## 2016-10-01 ENCOUNTER — Telehealth: Payer: Self-pay | Admitting: Internal Medicine

## 2016-10-01 NOTE — Telephone Encounter (Signed)
LM for pt to schedule Welcome to Medicare visit up until November 1st.

## 2016-10-04 DIAGNOSIS — H524 Presbyopia: Secondary | ICD-10-CM | POA: Diagnosis not present

## 2016-11-01 ENCOUNTER — Ambulatory Visit (INDEPENDENT_AMBULATORY_CARE_PROVIDER_SITE_OTHER): Payer: Medicare HMO | Admitting: Internal Medicine

## 2016-11-01 ENCOUNTER — Other Ambulatory Visit (INDEPENDENT_AMBULATORY_CARE_PROVIDER_SITE_OTHER): Payer: Medicare HMO

## 2016-11-01 ENCOUNTER — Encounter: Payer: Self-pay | Admitting: Internal Medicine

## 2016-11-01 VITALS — BP 132/90 | HR 71 | Temp 97.9°F | Ht 66.5 in | Wt 218.0 lb

## 2016-11-01 DIAGNOSIS — F17200 Nicotine dependence, unspecified, uncomplicated: Secondary | ICD-10-CM | POA: Diagnosis not present

## 2016-11-01 DIAGNOSIS — Z23 Encounter for immunization: Secondary | ICD-10-CM

## 2016-11-01 DIAGNOSIS — I10 Essential (primary) hypertension: Secondary | ICD-10-CM

## 2016-11-01 DIAGNOSIS — Z299 Encounter for prophylactic measures, unspecified: Secondary | ICD-10-CM

## 2016-11-01 DIAGNOSIS — Z0000 Encounter for general adult medical examination without abnormal findings: Secondary | ICD-10-CM | POA: Insufficient documentation

## 2016-11-01 DIAGNOSIS — E785 Hyperlipidemia, unspecified: Secondary | ICD-10-CM | POA: Diagnosis not present

## 2016-11-01 LAB — COMPREHENSIVE METABOLIC PANEL
ALT: 49 U/L (ref 0–53)
AST: 26 U/L (ref 0–37)
Albumin: 4.1 g/dL (ref 3.5–5.2)
Alkaline Phosphatase: 78 U/L (ref 39–117)
BILIRUBIN TOTAL: 0.6 mg/dL (ref 0.2–1.2)
BUN: 11 mg/dL (ref 6–23)
CALCIUM: 10.2 mg/dL (ref 8.4–10.5)
CO2: 35 meq/L — AB (ref 19–32)
CREATININE: 1.2 mg/dL (ref 0.40–1.50)
Chloride: 100 mEq/L (ref 96–112)
GFR: 77.91 mL/min (ref >60.00)
GLUCOSE: 101 mg/dL — AB (ref 70–99)
Potassium: 3.4 mEq/L — ABNORMAL LOW (ref 3.5–5.1)
Sodium: 139 mEq/L (ref 135–145)
Total Protein: 8.2 g/dL (ref 6.0–8.3)

## 2016-11-01 LAB — CBC
HCT: 45.8 % (ref 39.0–52.0)
Hemoglobin: 14.8 g/dL (ref 13.0–17.0)
MCHC: 32.4 g/dL (ref 30.0–36.0)
MCV: 84.6 fl (ref 78.0–100.0)
Platelets: 270 10*3/uL (ref 150.0–400.0)
RBC: 5.41 Mil/uL (ref 4.22–5.81)
RDW: 15.2 % (ref 11.5–15.5)
WBC: 7 10*3/uL (ref 4.0–10.5)

## 2016-11-01 LAB — LIPID PANEL
CHOL/HDL RATIO: 6
CHOLESTEROL: 203 mg/dL — AB (ref 0–200)
HDL: 34.7 mg/dL — ABNORMAL LOW (ref >39.00)
NonHDL: 168.65
Triglycerides: 230 mg/dL — ABNORMAL HIGH (ref 0.0–149.0)
VLDL: 46 mg/dL — ABNORMAL HIGH (ref 0.0–40.0)

## 2016-11-01 LAB — HEMOGLOBIN A1C: Hgb A1c MFr Bld: 6.8 % — ABNORMAL HIGH (ref 4.6–6.5)

## 2016-11-01 LAB — LDL CHOLESTEROL, DIRECT: LDL DIRECT: 125 mg/dL

## 2016-11-01 NOTE — Assessment & Plan Note (Signed)
Checking lipid panel and adjust as needed lipitor 20 mg daily.  

## 2016-11-01 NOTE — Progress Notes (Signed)
   Subjective:    Patient ID: Tyler Klein, male    DOB: 09-11-1950, 66 y.o.   MRN: [id number]  HPI Here for welcome to medicare visit, no new complaints. Please see A/P for status and treatment of chronic medical problems.   Diet: heart healthy Physical activity: sedentary Depression/mood screen: negative Hearing: intact to whispered voice Visual acuity: grossly normal, see vision screening results done at the visit performs annual eye exam  ADLs: capable Fall risk: none Home safety: good Cognitive evaluation: intact to orientation, naming, recall and repetition EOL planning: adv directives discussed  I have personally reviewed and have noted 1. The patient's medical and social history - reviewed today no changes 2. Their use of alcohol, tobacco or illicit drugs 3. Their current medications and supplements 4. The patient's functional ability including ADL's, fall risks, home safety risks and hearing or visual impairment. 5. Diet and physical activities 6. Evidence for depression or mood disorders 7. Care team reviewed and updated (available in snapshot)  Review of Systems  Constitutional: Negative.   HENT: Negative.   Eyes: Negative.   Respiratory: Negative for cough, chest tightness and shortness of breath.   Cardiovascular: Negative for chest pain, palpitations and leg swelling.  Gastrointestinal: Negative for abdominal distention, abdominal pain, constipation, diarrhea, nausea and vomiting.  Musculoskeletal: Negative.   Skin: Negative.   Neurological: Negative.   Psychiatric/Behavioral: Negative.       Objective:   Physical Exam  Constitutional: He is oriented to person, place, and time. He appears well-developed and well-nourished.  HENT:  Head: Normocephalic and atraumatic.  Eyes: EOM are normal.  Neck: Normal range of motion.  Cardiovascular: Normal rate and regular rhythm.   Pulmonary/Chest: Effort normal and breath sounds normal. No respiratory distress. He  has no wheezes. He has no rales.  Abdominal: Soft. Bowel sounds are normal. He exhibits no distension. There is no tenderness. There is no rebound.  Musculoskeletal: He exhibits no edema.  Neurological: He is alert and oriented to person, place, and time. Coordination normal.  Skin: Skin is warm and dry.  Psychiatric: He has a normal mood and affect.   Vitals:   11/01/16 1010  BP: 132/90  Pulse: 71  Temp: 97.9 F (36.6 C)  TempSrc: Oral  SpO2: 98%  Weight: 218 lb (98.9 kg)  Height: 5' 6.5" (1.689 m)   EKG: rate 70, axis normal, intervals normal, sinus, no st or t wave changes and no prior to compare    Assessment & Plan:  Prevnar 13 given at visit

## 2016-11-01 NOTE — Assessment & Plan Note (Signed)
Prevnar 13 given at visit, declines flu shot. Tetanus up to date. Colonoscopy up to date. Counseled about the need to stop smoking and work on diet and exercise. Given 10 year screening recommendations.

## 2016-11-01 NOTE — Assessment & Plan Note (Signed)
Amlodipine, lisinopril/hctz and BP at goal. Checking CMP and adjust as needed.

## 2016-11-01 NOTE — Assessment & Plan Note (Signed)
Counseled about the need to quit and the adverse effects of smoking on his health and he is not able to quit at this time.

## 2016-11-01 NOTE — Patient Instructions (Addendum)
We have checked the EKG of the heart which is normal.   We are checking the blood work and will call you back with the results.   We have sent in the refills today.    Health Maintenance, Male A healthy lifestyle and preventive care is important for your health and wellness. Ask your health care provider about what schedule of regular examinations is right for you. What should I know about weight and diet? Eat a Healthy Diet  Eat plenty of vegetables, fruits, whole grains, low-fat dairy products, and lean protein.  Do not eat a lot of foods high in solid fats, added sugars, or salt.  Maintain a Healthy Weight Regular exercise can help you achieve or maintain a healthy weight. You should:  Do at least 150 minutes of exercise each week. The exercise should increase your heart rate and make you sweat (moderate-intensity exercise).  Do strength-training exercises at least twice a week.  Watch Your Levels of Cholesterol and Blood Lipids  Have your blood tested for lipids and cholesterol every 5 years starting at 66 years of age. If you are at high risk for heart disease, you should start having your blood tested when you are 66 years old. You may need to have your cholesterol levels checked more often if: ? Your lipid or cholesterol levels are high. ? You are older than 66 years of age. ? You are at high risk for heart disease.  What should I know about cancer screening? Many types of cancers can be detected early and may often be prevented. Lung Cancer  You should be screened every year for lung cancer if: ? You are a current smoker who has smoked for at least 30 years. ? You are a former smoker who has quit within the past 15 years.  Talk to your health care provider about your screening options, when you should start screening, and how often you should be screened.  Colorectal Cancer  Routine colorectal cancer screening usually begins at 66 years of age and should be repeated  every 5-10 years until you are 66 years old. You may need to be screened more often if early forms of precancerous polyps or small growths are found. Your health care provider may recommend screening at an earlier age if you have risk factors for colon cancer.  Your health care provider may recommend using home test kits to check for hidden blood in the stool.  A small camera at the end of a tube can be used to examine your colon (sigmoidoscopy or colonoscopy). This checks for the earliest forms of colorectal cancer.  Prostate and Testicular Cancer  Depending on your age and overall health, your health care provider may do certain tests to screen for prostate and testicular cancer.  Talk to your health care provider about any symptoms or concerns you have about testicular or prostate cancer.  Skin Cancer  Check your skin from head to toe regularly.  Tell your health care provider about any new moles or changes in moles, especially if: ? There is a change in a mole's size, shape, or color. ? You have a mole that is larger than a pencil eraser.  Always use sunscreen. Apply sunscreen liberally and repeat throughout the day.  Protect yourself by wearing long sleeves, pants, a wide-brimmed hat, and sunglasses when outside.  What should I know about heart disease, diabetes, and high blood pressure?  If you are 72-43 years of age, have your blood pressure  checked every 3-5 years. If you are 88 years of age or older, have your blood pressure checked every year. You should have your blood pressure measured twice-once when you are at a hospital or clinic, and once when you are not at a hospital or clinic. Record the average of the two measurements. To check your blood pressure when you are not at a hospital or clinic, you can use: ? An automated blood pressure machine at a pharmacy. ? A home blood pressure monitor.  Talk to your health care provider about your target blood pressure.  If you are  between 38-76 years old, ask your health care provider if you should take aspirin to prevent heart disease.  Have regular diabetes screenings by checking your fasting blood sugar level. ? If you are at a normal weight and have a low risk for diabetes, have this test once every three years after the age of 94. ? If you are overweight and have a high risk for diabetes, consider being tested at a younger age or more often.  A one-time screening for abdominal aortic aneurysm (AAA) by ultrasound is recommended for men aged 50-75 years who are current or former smokers. What should I know about preventing infection? Hepatitis B If you have a higher risk for hepatitis B, you should be screened for this virus. Talk with your health care provider to find out if you are at risk for hepatitis B infection. Hepatitis C Blood testing is recommended for:  Everyone born from 1 through 1965.  Anyone with known risk factors for hepatitis C.  Sexually Transmitted Diseases (STDs)  You should be screened each year for STDs including gonorrhea and chlamydia if: ? You are sexually active and are younger than 66 years of age. ? You are older than 66 years of age and your health care provider tells you that you are at risk for this type of infection. ? Your sexual activity has changed since you were last screened and you are at an increased risk for chlamydia or gonorrhea. Ask your health care provider if you are at risk.  Talk with your health care provider about whether you are at high risk of being infected with HIV. Your health care provider may recommend a prescription medicine to help prevent HIV infection.  What else can I do?  Schedule regular health, dental, and eye exams.  Stay current with your vaccines (immunizations).  Do not use any tobacco products, such as cigarettes, chewing tobacco, and e-cigarettes. If you need help quitting, ask your health care provider.  Limit alcohol intake to no  more than 2 drinks per day. One drink equals 12 ounces of beer, 5 ounces of wine, or 1 ounces of hard liquor.  Do not use street drugs.  Do not share needles.  Ask your health care provider for help if you need support or information about quitting drugs.  Tell your health care provider if you often feel depressed.  Tell your health care provider if you have ever been abused or do not feel safe at home. This information is not intended to replace advice given to you by your health care provider. Make sure you discuss any questions you have with your health care provider. Document Released: 06/23/2007 Document Revised: 08/24/2015 Document Reviewed: 09/28/2014 Elsevier Interactive Patient Education  [employer].

## 2016-12-19 ENCOUNTER — Telehealth: Payer: Self-pay | Admitting: Internal Medicine

## 2016-12-19 ENCOUNTER — Other Ambulatory Visit: Payer: Self-pay | Admitting: *Deleted

## 2016-12-19 DIAGNOSIS — Z760 Encounter for issue of repeat prescription: Secondary | ICD-10-CM

## 2016-12-19 MED ORDER — AMLODIPINE BESYLATE 10 MG PO TABS: 10.0000 mg | ORAL_TABLET | Freq: Every day | ORAL | 1 refills | Status: DC

## 2016-12-19 MED ORDER — ATORVASTATIN CALCIUM 20 MG PO TABS: 20.0000 mg | ORAL_TABLET | Freq: Every day | ORAL | 1 refills | Status: DC

## 2016-12-19 MED ORDER — LISINOPRIL-HYDROCHLOROTHIAZIDE 20-12.5 MG PO TABS: 1.0000 | ORAL_TABLET | Freq: Every day | ORAL | 1 refills | Status: DC

## 2016-12-19 NOTE — Telephone Encounter (Unsigned)
Copied from [place] #[id number]. Topic: Quick Communication - See Telephone Encounter >> Dec 19, 2016  9:28 AM Hewitt Shorts wrote: CRM for notification. See Telephone encounter for: pt  is requesting the blood pressure and cholesteral meds he states he has requested from 12/02/16 and the pharmacy has not heard anything from [place]   Best number [phone]  12/19/16. >> Dec 19, 2016 11:32 AM Marja Kays F wrote: Pt is needing a refill on his norvasc, lipitor, and lisinopril hctz

## 2016-12-19 NOTE — Telephone Encounter (Signed)
Patient seen 11/01/2016- refills OK'd per visit

## 2017-03-21 ENCOUNTER — Ambulatory Visit: Payer: Medicare HMO | Admitting: Internal Medicine

## 2017-03-21 ENCOUNTER — Other Ambulatory Visit (INDEPENDENT_AMBULATORY_CARE_PROVIDER_SITE_OTHER): Payer: Medicare HMO

## 2017-03-21 ENCOUNTER — Encounter: Payer: Self-pay | Admitting: Internal Medicine

## 2017-03-21 VITALS — BP 104/80 | HR 67 | Temp 98.0°F | Ht 66.5 in | Wt 217.0 lb

## 2017-03-21 DIAGNOSIS — E785 Hyperlipidemia, unspecified: Secondary | ICD-10-CM

## 2017-03-21 DIAGNOSIS — I10 Essential (primary) hypertension: Secondary | ICD-10-CM

## 2017-03-21 DIAGNOSIS — E1169 Type 2 diabetes mellitus with other specified complication: Secondary | ICD-10-CM | POA: Diagnosis not present

## 2017-03-21 DIAGNOSIS — E119 Type 2 diabetes mellitus without complications: Secondary | ICD-10-CM | POA: Diagnosis not present

## 2017-03-21 LAB — LDL CHOLESTEROL, DIRECT: Direct LDL: 162 mg/dL

## 2017-03-21 LAB — COMPREHENSIVE METABOLIC PANEL
ALBUMIN: 4 g/dL (ref 3.5–5.2)
ALT: 33 U/L (ref 0–53)
AST: 18 U/L (ref 0–37)
Alkaline Phosphatase: 78 U/L (ref 39–117)
BUN: 14 mg/dL (ref 6–23)
CALCIUM: 9.7 mg/dL (ref 8.4–10.5)
CHLORIDE: 100 meq/L (ref 96–112)
CO2: 33 mEq/L — ABNORMAL HIGH (ref 19–32)
Creatinine, Ser: 1.3 mg/dL (ref 0.40–1.50)
GFR: 70.95 mL/min (ref >60.00)
Glucose, Bld: 111 mg/dL — ABNORMAL HIGH (ref 70–99)
POTASSIUM: 3.2 meq/L — AB (ref 3.5–5.1)
Sodium: 138 mEq/L (ref 135–145)
Total Bilirubin: 0.6 mg/dL (ref 0.2–1.2)
Total Protein: 7.8 g/dL (ref 6.0–8.3)

## 2017-03-21 LAB — HEMOGLOBIN A1C: Hgb A1c MFr Bld: 6.5 % (ref 4.6–6.5)

## 2017-03-21 LAB — LIPID PANEL
CHOL/HDL RATIO: 7
CHOLESTEROL: 239 mg/dL — AB (ref 0–200)
HDL: 34.9 mg/dL — AB (ref >39.00)
NonHDL: 204.47
TRIGLYCERIDES: 217 mg/dL — AB (ref 0.0–149.0)
VLDL: 43.4 mg/dL — AB (ref 0.0–40.0)

## 2017-03-21 MED ORDER — LISINOPRIL-HYDROCHLOROTHIAZIDE 20-12.5 MG PO TABS: 1.0000 | ORAL_TABLET | Freq: Every day | ORAL | 1 refills | Status: DC

## 2017-03-21 MED ORDER — ATORVASTATIN CALCIUM 20 MG PO TABS: 20.0000 mg | ORAL_TABLET | Freq: Every day | ORAL | 1 refills | Status: DC

## 2017-03-21 MED ORDER — AMLODIPINE BESYLATE 10 MG PO TABS: 10.0000 mg | ORAL_TABLET | Freq: Every day | ORAL | 1 refills | Status: DC

## 2017-03-21 NOTE — Assessment & Plan Note (Signed)
Checking lipid panel and adjust as needed lipitor 20 mg daily for LDL <100.

## 2017-03-21 NOTE — Progress Notes (Signed)
   Subjective:    Patient ID: Tyler Klein, male    DOB: 1950/07/03, 67 y.o.   MRN: [id number]  HPI The patient is a 67 YO man coming in for new diabetes (not taking meds, continued cholesterol medicine, taking ACE-I, no change to diet and exercise), cholesterol (resumed lipitor after last visit, needs recheck, denies side effects). He drinks a lot of sodas and had doughnuts for breakfast. Denies numbness in his feet.   Review of Systems  Constitutional: Negative.   HENT: Negative.   Eyes: Negative.   Respiratory: Negative for cough, chest tightness and shortness of breath.   Cardiovascular: Negative for chest pain, palpitations and leg swelling.  Gastrointestinal: Negative for abdominal distention, abdominal pain, constipation, diarrhea, nausea and vomiting.  Musculoskeletal: Negative.   Skin: Negative.   Neurological: Negative.   Psychiatric/Behavioral: Negative.       Objective:   Physical Exam  Constitutional: He is oriented to person, place, and time. He appears well-developed and well-nourished.  HENT:  Head: Normocephalic and atraumatic.  Eyes: EOM are normal.  Neck: Normal range of motion.  Cardiovascular: Normal rate and regular rhythm.  Pulmonary/Chest: Effort normal and breath sounds normal. No respiratory distress. He has no wheezes. He has no rales.  Abdominal: Soft. Bowel sounds are normal. He exhibits no distension. There is no tenderness. There is no rebound.  Musculoskeletal: He exhibits no edema.  Neurological: He is alert and oriented to person, place, and time. Coordination normal.  Skin: Skin is warm and dry.  Foot exam done  Psychiatric: He has a normal mood and affect.   Vitals:   03/21/17 1100  BP: 104/80  Pulse: 67  Temp: 98 F (36.7 C)  TempSrc: Oral  SpO2: 97%  Weight: 217 lb (98.4 kg)  Height: 5' 6.5" (1.689 m)      Assessment & Plan:

## 2017-03-21 NOTE — Assessment & Plan Note (Signed)
Checking HgA1c today and adjust as needed. Talked to him about diet changes and he is going to give up sodas as he would like to avoid medications. On ACE-I and statin. Foot exam done and reminded about yearly eye exam.

## 2017-03-21 NOTE — Patient Instructions (Addendum)
We will check the labs today.  5 g of carbohydrates in an energy drink is okay.   Diabetes Mellitus and Nutrition When you have diabetes (diabetes mellitus), it is very important to have healthy eating habits because your blood sugar (glucose) levels are greatly affected by what you eat and drink. Eating healthy foods in the appropriate amounts, at about the same times every day, can help you:  Control your blood glucose.  Lower your risk of heart disease.  Improve your blood pressure.  Reach or maintain a healthy weight.  Every person with diabetes is different, and each person has different needs for a meal plan. Your health care provider may recommend that you work with a diet and nutrition specialist (dietitian) to make a meal plan that is best for you. Your meal plan may vary depending on factors such as:  The calories you need.  The medicines you take.  Your weight.  Your blood glucose, blood pressure, and cholesterol levels.  Your activity level.  Other health conditions you have, such as heart or kidney disease.  How do carbohydrates affect me? Carbohydrates affect your blood glucose level more than any other type of food. Eating carbohydrates naturally increases the amount of glucose in your blood. Carbohydrate counting is a method for keeping track of how many carbohydrates you eat. Counting carbohydrates is important to keep your blood glucose at a healthy level, especially if you use insulin or take certain oral diabetes medicines. It is important to know how many carbohydrates you can safely have in each meal. This is different for every person. Your dietitian can help you calculate how many carbohydrates you should have at each meal and for snack. Foods that contain carbohydrates include:  Bread, cereal, rice, pasta, and crackers.  Potatoes and corn.  Peas, beans, and lentils.  Milk and yogurt.  Fruit and juice.  Desserts, such as cakes, cookies, ice cream,  and candy.  How does alcohol affect me? Alcohol can cause a sudden decrease in blood glucose (hypoglycemia), especially if you use insulin or take certain oral diabetes medicines. Hypoglycemia can be a life-threatening condition. Symptoms of hypoglycemia (sleepiness, dizziness, and confusion) are similar to symptoms of having too much alcohol. If your health care provider says that alcohol is safe for you, follow these guidelines:  Limit alcohol intake to no more than 1 drink per day for nonpregnant women and 2 drinks per day for men. One drink equals 12 oz of beer, 5 oz of wine, or 1 oz of hard liquor.  Do not drink on an empty stomach.  Keep yourself hydrated with water, diet soda, or unsweetened iced tea.  Keep in mind that regular soda, juice, and other mixers may contain a lot of sugar and must be counted as carbohydrates.  What are tips for following this plan? Reading food labels  Start by checking the serving size on the label. The amount of calories, carbohydrates, fats, and other nutrients listed on the label are based on one serving of the food. Many foods contain more than one serving per package.  Check the total grams (g) of carbohydrates in one serving. You can calculate the number of servings of carbohydrates in one serving by dividing the total carbohydrates by 15. For example, if a food has 30 g of total carbohydrates, it would be equal to 2 servings of carbohydrates.  Check the number of grams (g) of saturated and trans fats in one serving. Choose foods that have low or  no amount of these fats.  Check the number of milligrams (mg) of sodium in one serving. Most people should limit total sodium intake to less than 2,300 mg per day.  Always check the nutrition information of foods labeled as "low-fat" or "nonfat". These foods may be higher in added sugar or refined carbohydrates and should be avoided.  Talk to your dietitian to identify your daily goals for nutrients  listed on the label. Shopping  Avoid buying canned, premade, or processed foods. These foods tend to be high in fat, sodium, and added sugar.  Shop around the outside edge of the grocery store. This includes fresh fruits and vegetables, bulk grains, fresh meats, and fresh dairy. Cooking  Use low-heat cooking methods, such as baking, instead of high-heat cooking methods like deep frying.  Cook using healthy oils, such as olive, canola, or sunflower oil.  Avoid cooking with butter, cream, or high-fat meats. Meal planning  Eat meals and snacks regularly, preferably at the same times every day. Avoid going long periods of time without eating.  Eat foods high in fiber, such as fresh fruits, vegetables, beans, and whole grains. Talk to your dietitian about how many servings of carbohydrates you can eat at each meal.  Eat 4-6 ounces of lean protein each day, such as lean meat, chicken, fish, eggs, or tofu. 1 ounce is equal to 1 ounce of meat, chicken, or fish, 1 egg, or 1/4 cup of tofu.  Eat some foods each day that contain healthy fats, such as avocado, nuts, seeds, and fish. Lifestyle   Check your blood glucose regularly.  Exercise at least 30 minutes 5 or more days each week, or as told by your health care provider.  Take medicines as told by your health care provider.  Do not use any products that contain nicotine or tobacco, such as cigarettes and e-cigarettes. If you need help quitting, ask your health care provider.  Work with a Social worker or diabetes educator to identify strategies to manage stress and any emotional and social challenges. What are some questions to ask my health care provider?  Do I need to meet with a diabetes educator?  Do I need to meet with a dietitian?  What number can I call if I have questions?  When are the best times to check my blood glucose? Where to find more information:  American Diabetes Association:  diabetes.org/food-and-fitness/food  Academy of Nutrition and Dietetics: PokerClues.dk  [employer] of Diabetes and Digestive and Kidney Diseases (NIH): ContactWire.be Summary  A healthy meal plan will help you control your blood glucose and maintain a healthy lifestyle.  Working with a diet and nutrition specialist (dietitian) can help you make a meal plan that is best for you.  Keep in mind that carbohydrates and alcohol have immediate effects on your blood glucose levels. It is important to count carbohydrates and to use alcohol carefully. This information is not intended to replace advice given to you by your health care provider. Make sure you discuss any questions you have with your health care provider. Document Released: 09/21/2004 Document Revised: 01/30/2016 Document Reviewed: 01/30/2016 Elsevier Interactive Patient Education  [employer].

## 2017-06-04 ENCOUNTER — Encounter: Payer: Self-pay | Admitting: Family Medicine

## 2017-07-04 ENCOUNTER — Encounter: Payer: Self-pay | Admitting: Internal Medicine

## 2017-07-04 ENCOUNTER — Ambulatory Visit (INDEPENDENT_AMBULATORY_CARE_PROVIDER_SITE_OTHER): Payer: Medicare HMO | Admitting: Internal Medicine

## 2017-07-04 VITALS — BP 118/80 | HR 67 | Temp 97.9°F | Ht 66.5 in | Wt 207.0 lb

## 2017-07-04 DIAGNOSIS — I10 Essential (primary) hypertension: Secondary | ICD-10-CM | POA: Diagnosis not present

## 2017-07-04 DIAGNOSIS — E119 Type 2 diabetes mellitus without complications: Secondary | ICD-10-CM

## 2017-07-04 DIAGNOSIS — E1169 Type 2 diabetes mellitus with other specified complication: Secondary | ICD-10-CM

## 2017-07-04 DIAGNOSIS — E785 Hyperlipidemia, unspecified: Secondary | ICD-10-CM | POA: Diagnosis not present

## 2017-07-04 MED ORDER — AMLODIPINE BESYLATE 10 MG PO TABS: 10.0000 mg | ORAL_TABLET | Freq: Every day | ORAL | 3 refills | Status: DC

## 2017-07-04 MED ORDER — ATORVASTATIN CALCIUM 20 MG PO TABS: 20.0000 mg | ORAL_TABLET | Freq: Every day | ORAL | 3 refills | Status: DC

## 2017-07-04 MED ORDER — LISINOPRIL-HYDROCHLOROTHIAZIDE 20-12.5 MG PO TABS: 1.0000 | ORAL_TABLET | Freq: Every day | ORAL | 3 refills | Status: DC

## 2017-07-04 NOTE — Progress Notes (Signed)
   Subjective:    Patient ID: Tyler Klein, male    DOB: [birth year], 67 y.o.   MRN: [id number]  HPI The patient is a 67 YO man coming in for follow up of his blood pressure and diabetes. He has made changes to his health since finding out about diabetes. This caused his HgA1c to come under control. He has continued those changes. Weight is down about 10 pounds since last visit. Denies numbness or burning pain in his feet. He denies headaches or chest pains. He denies side effects from medications. Blood pressure is doing well at home.   Review of Systems  Constitutional: Negative.   HENT: Negative.   Eyes: Negative.   Respiratory: Negative for cough, chest tightness and shortness of breath.   Cardiovascular: Negative for chest pain, palpitations and leg swelling.  Gastrointestinal: Negative for abdominal distention, abdominal pain, constipation, diarrhea, nausea and vomiting.  Musculoskeletal: Negative.   Skin: Negative.   Neurological: Negative.   Psychiatric/Behavioral: Negative.       Objective:   Physical Exam  Constitutional: He is oriented to person, place, and time. He appears well-developed and well-nourished.  HENT:  Head: Normocephalic and atraumatic.  Eyes: EOM are normal.  Neck: Normal range of motion.  Cardiovascular: Normal rate and regular rhythm.  Pulmonary/Chest: Effort normal and breath sounds normal. No respiratory distress. He has no wheezes. He has no rales.  Abdominal: Soft. Bowel sounds are normal. He exhibits no distension. There is no tenderness. There is no rebound.  Musculoskeletal: He exhibits no edema.  Neurological: He is alert and oriented to person, place, and time. Coordination normal.  Skin: Skin is warm and dry.  Psychiatric: He has a normal mood and affect.   Vitals:   07/04/17 0910  BP: 118/80  Pulse: 67  Temp: 97.9 F (36.6 C)  TempSrc: Oral  SpO2: 95%  Weight: 207 lb (93.9 kg)  Height: 5' 6.5" (1.689 m)      Assessment & Plan:

## 2017-07-04 NOTE — Patient Instructions (Signed)
We have sent in the refills today for you. We will see you back in October or November for the physical.

## 2017-07-05 NOTE — Assessment & Plan Note (Signed)
Weight is down about 10 pounds with continued dietary changes and he is working on more weight loss.

## 2017-07-05 NOTE — Assessment & Plan Note (Signed)
Taking amlodipine 10 mg daily and lisinopril/hctz 20/12.5 and continue. Recent CMP reviewed and no indications for change. BP is okay. If continued weight loss we may be able to reduce amlodipine or stop and he is excited about that.

## 2017-07-05 NOTE — Assessment & Plan Note (Signed)
Taking lipitor 20 mg daily and refilled today.

## 2017-07-08 ENCOUNTER — Other Ambulatory Visit: Payer: Self-pay | Admitting: Internal Medicine

## 2017-07-08 DIAGNOSIS — Z760 Encounter for issue of repeat prescription: Secondary | ICD-10-CM

## 2017-07-12 ENCOUNTER — Other Ambulatory Visit: Payer: Self-pay | Admitting: Internal Medicine

## 2017-07-12 DIAGNOSIS — Z760 Encounter for issue of repeat prescription: Secondary | ICD-10-CM

## 2017-07-13 ENCOUNTER — Other Ambulatory Visit: Payer: Self-pay | Admitting: Internal Medicine

## 2017-07-13 DIAGNOSIS — Z760 Encounter for issue of repeat prescription: Secondary | ICD-10-CM

## 2017-10-22 ENCOUNTER — Encounter: Payer: Self-pay | Admitting: Internal Medicine

## 2017-10-22 ENCOUNTER — Ambulatory Visit (INDEPENDENT_AMBULATORY_CARE_PROVIDER_SITE_OTHER): Payer: Medicare HMO | Admitting: Internal Medicine

## 2017-11-19 ENCOUNTER — Encounter: Payer: Medicare HMO | Admitting: Internal Medicine

## 2017-11-29 ENCOUNTER — Other Ambulatory Visit (INDEPENDENT_AMBULATORY_CARE_PROVIDER_SITE_OTHER): Payer: Medicare HMO

## 2017-11-29 ENCOUNTER — Encounter: Payer: Self-pay | Admitting: Internal Medicine

## 2017-11-29 ENCOUNTER — Ambulatory Visit (INDEPENDENT_AMBULATORY_CARE_PROVIDER_SITE_OTHER): Payer: Medicare HMO | Admitting: Internal Medicine

## 2017-11-29 VITALS — BP 130/80 | HR 76 | Temp 97.8°F | Ht 66.5 in | Wt 208.0 lb

## 2017-11-29 DIAGNOSIS — Z0000 Encounter for general adult medical examination without abnormal findings: Secondary | ICD-10-CM | POA: Diagnosis not present

## 2017-11-29 DIAGNOSIS — E1169 Type 2 diabetes mellitus with other specified complication: Secondary | ICD-10-CM

## 2017-11-29 DIAGNOSIS — I10 Essential (primary) hypertension: Secondary | ICD-10-CM | POA: Diagnosis not present

## 2017-11-29 DIAGNOSIS — E785 Hyperlipidemia, unspecified: Secondary | ICD-10-CM | POA: Diagnosis not present

## 2017-11-29 DIAGNOSIS — F17200 Nicotine dependence, unspecified, uncomplicated: Secondary | ICD-10-CM | POA: Diagnosis not present

## 2017-11-29 DIAGNOSIS — E119 Type 2 diabetes mellitus without complications: Secondary | ICD-10-CM

## 2017-11-29 LAB — LIPID PANEL
CHOL/HDL RATIO: 6
CHOLESTEROL: 197 mg/dL (ref 0–200)
HDL: 31.7 mg/dL — ABNORMAL LOW (ref >39.00)
NonHDL: 165.62
Triglycerides: 229 mg/dL — ABNORMAL HIGH (ref 0.0–149.0)
VLDL: 45.8 mg/dL — ABNORMAL HIGH (ref 0.0–40.0)

## 2017-11-29 LAB — COMPREHENSIVE METABOLIC PANEL
ALK PHOS: 81 U/L (ref 39–117)
ALT: 25 U/L (ref 0–53)
AST: 19 U/L (ref 0–37)
Albumin: 3.8 g/dL (ref 3.5–5.2)
BUN: 20 mg/dL (ref 6–23)
CO2: 32 mEq/L (ref 19–32)
CREATININE: 1.35 mg/dL (ref 0.40–1.50)
Calcium: 10 mg/dL (ref 8.4–10.5)
Chloride: 104 mEq/L (ref 96–112)
GFR: 67.79 mL/min (ref >60.00)
Glucose, Bld: 125 mg/dL — ABNORMAL HIGH (ref 70–99)
Potassium: 3.4 mEq/L — ABNORMAL LOW (ref 3.5–5.1)
SODIUM: 142 meq/L (ref 135–145)
Total Bilirubin: 0.4 mg/dL (ref 0.2–1.2)
Total Protein: 7.5 g/dL (ref 6.0–8.3)

## 2017-11-29 LAB — CBC
HCT: 42.9 % (ref 39.0–52.0)
HEMOGLOBIN: 14.2 g/dL (ref 13.0–17.0)
MCHC: 33.1 g/dL (ref 30.0–36.0)
MCV: 81.4 fl (ref 78.0–100.0)
Platelets: 259 10*3/uL (ref 150.0–400.0)
RBC: 5.27 Mil/uL (ref 4.22–5.81)
RDW: 14.9 % (ref 11.5–15.5)
WBC: 7 10*3/uL (ref 4.0–10.5)

## 2017-11-29 LAB — HEMOGLOBIN A1C: Hgb A1c MFr Bld: 6.4 % (ref 4.6–6.5)

## 2017-11-29 LAB — LDL CHOLESTEROL, DIRECT: LDL DIRECT: 132 mg/dL

## 2017-11-29 NOTE — Progress Notes (Signed)
   Subjective:    Patient ID: Tyler Klein, male    DOB: 04/26/1950, 67 y.o.   MRN: [id number]  HPI Here for medicare wellness and physical, no new complaints. Please see A/P for status and treatment of chronic medical problems.   Diet: DM since diabetic Physical activity: sedentary Depression/mood screen: negative Hearing: intact to whispered voice Visual acuity: grossly normal, performs annual eye exam  ADLs: capable Fall risk: none Home safety: good Cognitive evaluation: intact to orientation, naming, recall and repetition EOL planning: adv directives discussed  I have personally reviewed and have noted 1. The patient's medical and social history - reviewed today no changes 2. Their use of alcohol, tobacco or illicit drugs 3. Their current medications and supplements 4. The patient's functional ability including ADL's, fall risks, home safety risks and hearing or visual impairment. 5. Diet and physical activities 6. Evidence for depression or mood disorders 7. Care team reviewed and updated (available in snapshot)  Review of Systems  Constitutional: Negative.   HENT: Negative.   Eyes: Negative.   Respiratory: Negative for cough, chest tightness and shortness of breath.   Cardiovascular: Negative for chest pain, palpitations and leg swelling.  Gastrointestinal: Negative for abdominal distention, abdominal pain, constipation, diarrhea, nausea and vomiting.  Musculoskeletal: Negative.   Skin: Negative.   Neurological: Negative.   Psychiatric/Behavioral: Negative.       Objective:   Physical Exam  Constitutional: He is oriented to person, place, and time. He appears well-developed and well-nourished.  Overweight  HENT:  Head: Normocephalic and atraumatic.  Eyes: EOM are normal.  Neck: Normal range of motion.  Cardiovascular: Normal rate and regular rhythm.  Pulmonary/Chest: Effort normal and breath sounds normal. No respiratory distress. He has no wheezes. He has no  rales.  Abdominal: Soft. Bowel sounds are normal. He exhibits no distension. There is no tenderness. There is no rebound.  Musculoskeletal: He exhibits no edema.  Neurological: He is alert and oriented to person, place, and time. Coordination normal.  Skin: Skin is warm and dry.  Psychiatric: He has a normal mood and affect.   Vitals:   11/29/17 0900  BP: 130/80  Pulse: 76  Temp: 97.8 F (36.6 C)  TempSrc: Oral  SpO2: 94%  Weight: 208 lb (94.3 kg)  Height: 5' 6.5" (1.689 m)      Assessment & Plan:

## 2017-11-29 NOTE — Assessment & Plan Note (Signed)
Checking microalbumin to creatinine ratio, HgA1c. Diet controlled currently. Taking ACE-I and statin. Adjust as needed. Reminded about yearly eye exam.

## 2017-11-29 NOTE — Assessment & Plan Note (Signed)
Flu shot declines. Pneumonia declines. Shingrix declines. Tetanus declines. Colonoscopy up to date. Counseled about sun safety and mole surveillance. Counseled about the dangers of distracted driving. Given 10 year screening recommendations.

## 2017-11-29 NOTE — Assessment & Plan Note (Signed)
Time spent counseling about tobacco usage: 4 minutes. I have asked about smoking and is smoking same as usual. The patient is advised to quit. The patient is willing to quit. They would like to try to quit in the next 6 months. We will follow up with them in 6 months.

## 2017-11-29 NOTE — Assessment & Plan Note (Signed)
Taking lisinopril/hctz and BP at goal. Checking CMP and adjust as needed.

## 2017-11-29 NOTE — Patient Instructions (Signed)

## 2017-11-29 NOTE — Assessment & Plan Note (Signed)
Checking lipid panel and adjust lipitor 20 mg daily as needed. 

## 2018-02-11 ENCOUNTER — Other Ambulatory Visit: Payer: Self-pay | Admitting: Internal Medicine

## 2018-02-11 DIAGNOSIS — I10 Essential (primary) hypertension: Secondary | ICD-10-CM

## 2018-08-07 ENCOUNTER — Other Ambulatory Visit (INDEPENDENT_AMBULATORY_CARE_PROVIDER_SITE_OTHER): Payer: Medicare HMO

## 2018-08-07 ENCOUNTER — Encounter: Payer: Self-pay | Admitting: Internal Medicine

## 2018-08-07 ENCOUNTER — Ambulatory Visit (INDEPENDENT_AMBULATORY_CARE_PROVIDER_SITE_OTHER): Payer: Medicare HMO | Admitting: Internal Medicine

## 2018-08-07 ENCOUNTER — Other Ambulatory Visit: Payer: Self-pay

## 2018-08-07 VITALS — BP 130/82 | HR 68 | Temp 98.1°F | Ht 66.5 in | Wt 207.0 lb

## 2018-08-07 DIAGNOSIS — I10 Essential (primary) hypertension: Secondary | ICD-10-CM

## 2018-08-07 DIAGNOSIS — E785 Hyperlipidemia, unspecified: Secondary | ICD-10-CM | POA: Diagnosis not present

## 2018-08-07 DIAGNOSIS — F17200 Nicotine dependence, unspecified, uncomplicated: Secondary | ICD-10-CM | POA: Diagnosis not present

## 2018-08-07 DIAGNOSIS — E1169 Type 2 diabetes mellitus with other specified complication: Secondary | ICD-10-CM | POA: Diagnosis not present

## 2018-08-07 LAB — LIPID PANEL
Cholesterol: 167 mg/dL (ref 0–200)
HDL: 39.4 mg/dL (ref >39.00)
LDL Cholesterol: 104 mg/dL — ABNORMAL HIGH (ref 0–99)
NonHDL: 127.81
Total CHOL/HDL Ratio: 4
Triglycerides: 120 mg/dL (ref 0.0–149.0)
VLDL: 24 mg/dL (ref 0.0–40.0)

## 2018-08-07 LAB — COMPREHENSIVE METABOLIC PANEL
ALT: 25 U/L (ref 0–53)
AST: 21 U/L (ref 0–37)
Albumin: 4.1 g/dL (ref 3.5–5.2)
Alkaline Phosphatase: 73 U/L (ref 39–117)
BUN: 19 mg/dL (ref 6–23)
CO2: 29 mEq/L (ref 19–32)
Calcium: 9.7 mg/dL (ref 8.4–10.5)
Chloride: 102 mEq/L (ref 96–112)
Creatinine, Ser: 1.47 mg/dL (ref 0.40–1.50)
GFR: 57.69 mL/min — ABNORMAL LOW (ref >60.00)
Glucose, Bld: 91 mg/dL (ref 70–99)
Potassium: 3.2 mEq/L — ABNORMAL LOW (ref 3.5–5.1)
Sodium: 138 mEq/L (ref 135–145)
Total Bilirubin: 0.6 mg/dL (ref 0.2–1.2)
Total Protein: 7.7 g/dL (ref 6.0–8.3)

## 2018-08-07 LAB — HEMOGLOBIN A1C: Hgb A1c MFr Bld: 6.3 % (ref 4.6–6.5)

## 2018-08-07 MED ORDER — LISINOPRIL-HYDROCHLOROTHIAZIDE 20-12.5 MG PO TABS: 1.0000 | ORAL_TABLET | Freq: Every day | ORAL | 3 refills | Status: DC

## 2018-08-07 MED ORDER — ATORVASTATIN CALCIUM 40 MG PO TABS: 40.0000 mg | ORAL_TABLET | Freq: Every day | ORAL | 3 refills | Status: DC

## 2018-08-07 MED ORDER — AMLODIPINE BESYLATE 10 MG PO TABS: 10.0000 mg | ORAL_TABLET | Freq: Every day | ORAL | 3 refills | Status: DC

## 2018-08-07 NOTE — Assessment & Plan Note (Signed)
BP at goal, checking CMP and adjust as needed amlodipine and lisinopril/hctz.

## 2018-08-07 NOTE — Progress Notes (Signed)
   Subjective:   Patient ID: Tyler Klein, male    DOB: [birth year], 68 y.o.   MRN: [id number]  HPI The patient is a 68 YO man coming in for follow up diabetes (currently diet controlled, taking statin and ACE-I, denies numbness or tingling in feet, some tingling in his left hand at night time, denies numbness or tingling now, has been working out some again), hyperlipidemia (taking lipitor daily, denies diet changes, is working out some again) and blood pressure (taking amlodipine, lisinopril/hctz, BP at goal, denies side effects, denies headaches or chest pains).   Review of Systems  Constitutional: Negative.   HENT: Negative.   Eyes: Negative.   Respiratory: Negative for cough, chest tightness and shortness of breath.   Cardiovascular: Negative for chest pain, palpitations and leg swelling.  Gastrointestinal: Negative for abdominal distention, abdominal pain, constipation, diarrhea, nausea and vomiting.  Musculoskeletal: Negative.   Skin: Negative.   Neurological: Negative.   Psychiatric/Behavioral: Negative.     Objective:  Physical Exam Constitutional:      Appearance: He is well-developed. He is obese.  HENT:     Head: Normocephalic and atraumatic.  Neck:     Musculoskeletal: Normal range of motion.  Cardiovascular:     Rate and Rhythm: Normal rate and regular rhythm.  Pulmonary:     Effort: Pulmonary effort is normal. No respiratory distress.     Breath sounds: Normal breath sounds. No wheezing or rales.  Abdominal:     General: Bowel sounds are normal. There is no distension.     Palpations: Abdomen is soft.     Tenderness: There is no abdominal tenderness. There is no rebound.  Skin:    General: Skin is warm and dry.     Comments: Foot exam done  Neurological:     Mental Status: He is alert and oriented to person, place, and time.     Coordination: Coordination normal.    Vitals:   08/07/18 1251  BP: 130/82  Pulse: 68  Temp: 98.1 F (36.7 C)  TempSrc: Oral   SpO2: 97%  Weight: 207 lb (93.9 kg)  Height: 5' 6.5" (1.689 m)    Assessment & Plan:

## 2018-08-07 NOTE — Assessment & Plan Note (Signed)
Checking HgA1c, foot exam done at visit. Reminded about eye exam and foot care. Not on meds for sugars. On ACE-I and statin.

## 2018-08-07 NOTE — Patient Instructions (Addendum)
We are checking the labs today.  

## 2018-08-07 NOTE — Assessment & Plan Note (Signed)
Not willing to quit at this time.

## 2018-08-07 NOTE — Assessment & Plan Note (Signed)
Increase lipitor to 40 mg daily as last LDL above goal. Checking lipid panel today and if LDL at goal will stay at 20 mg daily.

## 2019-04-30 ENCOUNTER — Telehealth: Payer: Self-pay | Admitting: Internal Medicine

## 2019-04-30 NOTE — Progress Notes (Signed)
Scheduled a call back in a month. Patient stated he is changing insurance and may need to find a PCP in network. Waiting on paperwork from present PCP.    Raynicia Dukes [employer] Scheduler

## 2019-08-26 ENCOUNTER — Telehealth: Payer: Self-pay

## 2019-08-26 DIAGNOSIS — E785 Hyperlipidemia, unspecified: Secondary | ICD-10-CM

## 2019-08-26 DIAGNOSIS — E1169 Type 2 diabetes mellitus with other specified complication: Secondary | ICD-10-CM

## 2019-08-26 DIAGNOSIS — I10 Essential (primary) hypertension: Secondary | ICD-10-CM

## 2019-08-26 MED ORDER — LISINOPRIL-HYDROCHLOROTHIAZIDE 20-12.5 MG PO TABS: 1.0000 | ORAL_TABLET | Freq: Every day | ORAL | 0 refills | Status: DC

## 2019-08-26 MED ORDER — AMLODIPINE BESYLATE 10 MG PO TABS: 10.0000 mg | ORAL_TABLET | Freq: Every day | ORAL | 0 refills | Status: DC

## 2019-08-26 MED ORDER — ATORVASTATIN CALCIUM 40 MG PO TABS: 40.0000 mg | ORAL_TABLET | Freq: Every day | ORAL | 0 refills | Status: DC

## 2019-08-26 NOTE — Telephone Encounter (Signed)
1.Medication Requested:  lisinopril-hydrochlorothiazide (ZESTORETIC) 20-12.5 MG tablet  amLODipine (NORVASC) 10 MG tablet  atorvastatin (LIPITOR) 40 MG tablet  2. Pharmacy (Name, Street, [place]):[facility] [address]  3. On Med List: Yes   4. Last Visit with PCP: 11.22.2019   5. Next visit date with PCP: 11.18.21    Agent: Please be advised that RX refills may take up to 3 business days. We ask that you follow-up with your pharmacy.

## 2019-11-26 ENCOUNTER — Encounter: Payer: Medicare HMO | Admitting: Internal Medicine

## 2019-12-08 ENCOUNTER — Other Ambulatory Visit: Payer: Self-pay | Admitting: Internal Medicine

## 2019-12-08 DIAGNOSIS — I10 Essential (primary) hypertension: Secondary | ICD-10-CM

## 2020-03-10 ENCOUNTER — Encounter: Payer: Medicare Other | Admitting: Internal Medicine

## 2020-03-10 ENCOUNTER — Other Ambulatory Visit: Payer: Self-pay

## 2020-03-10 ENCOUNTER — Ambulatory Visit (INDEPENDENT_AMBULATORY_CARE_PROVIDER_SITE_OTHER): Payer: Medicare HMO | Admitting: Internal Medicine

## 2020-03-10 ENCOUNTER — Encounter: Payer: Self-pay | Admitting: Internal Medicine

## 2020-03-10 VITALS — BP 122/80 | HR 79 | Temp 98.3°F | Resp 18 | Ht 66.5 in | Wt 211.2 lb

## 2020-03-10 DIAGNOSIS — I10 Essential (primary) hypertension: Secondary | ICD-10-CM | POA: Diagnosis not present

## 2020-03-10 DIAGNOSIS — F17200 Nicotine dependence, unspecified, uncomplicated: Secondary | ICD-10-CM

## 2020-03-10 DIAGNOSIS — E1169 Type 2 diabetes mellitus with other specified complication: Secondary | ICD-10-CM

## 2020-03-10 DIAGNOSIS — K635 Polyp of colon: Secondary | ICD-10-CM | POA: Diagnosis not present

## 2020-03-10 DIAGNOSIS — E785 Hyperlipidemia, unspecified: Secondary | ICD-10-CM | POA: Diagnosis not present

## 2020-03-10 DIAGNOSIS — Z0000 Encounter for general adult medical examination without abnormal findings: Secondary | ICD-10-CM

## 2020-03-10 DIAGNOSIS — Z23 Encounter for immunization: Secondary | ICD-10-CM

## 2020-03-10 LAB — COMPREHENSIVE METABOLIC PANEL
ALT: 26 U/L (ref 0–53)
AST: 20 U/L (ref 0–37)
Albumin: 3.8 g/dL (ref 3.5–5.2)
Alkaline Phosphatase: 65 U/L (ref 39–117)
BUN: 25 mg/dL — ABNORMAL HIGH (ref 6–23)
CO2: 31 mEq/L (ref 19–32)
Calcium: 9.9 mg/dL (ref 8.4–10.5)
Chloride: 100 mEq/L (ref 96–112)
Creatinine, Ser: 1.59 mg/dL — ABNORMAL HIGH (ref 0.40–1.50)
GFR: 44.08 mL/min — ABNORMAL LOW (ref >60.00)
Glucose, Bld: 141 mg/dL — ABNORMAL HIGH (ref 70–99)
Potassium: 3.6 mEq/L (ref 3.5–5.1)
Sodium: 137 mEq/L (ref 135–145)
Total Bilirubin: 0.5 mg/dL (ref 0.2–1.2)
Total Protein: 7.8 g/dL (ref 6.0–8.3)

## 2020-03-10 LAB — LDL CHOLESTEROL, DIRECT: Direct LDL: 166 mg/dL

## 2020-03-10 LAB — CBC
HCT: 45.5 % (ref 39.0–52.0)
Hemoglobin: 15 g/dL (ref 13.0–17.0)
MCHC: 33 g/dL (ref 30.0–36.0)
MCV: 82.3 fl (ref 78.0–100.0)
Platelets: 244 10*3/uL (ref 150.0–400.0)
RBC: 5.52 Mil/uL (ref 4.22–5.81)
RDW: 15.3 % (ref 11.5–15.5)
WBC: 7.7 10*3/uL (ref 4.0–10.5)

## 2020-03-10 LAB — LIPID PANEL
Cholesterol: 284 mg/dL — ABNORMAL HIGH (ref 0–200)
HDL: 34.1 mg/dL — ABNORMAL LOW (ref >39.00)
NonHDL: 250.12
Total CHOL/HDL Ratio: 8
Triglycerides: 338 mg/dL — ABNORMAL HIGH (ref 0.0–149.0)
VLDL: 67.6 mg/dL — ABNORMAL HIGH (ref 0.0–40.0)

## 2020-03-10 LAB — HEMOGLOBIN A1C: Hgb A1c MFr Bld: 6.5 % (ref 4.6–6.5)

## 2020-03-10 MED ORDER — LISINOPRIL-HYDROCHLOROTHIAZIDE 20-12.5 MG PO TABS: 1.0000 | ORAL_TABLET | Freq: Every day | ORAL | 3 refills | Status: DC

## 2020-03-10 MED ORDER — AMLODIPINE BESYLATE 10 MG PO TABS: 10.0000 mg | ORAL_TABLET | Freq: Every day | ORAL | 3 refills | Status: DC

## 2020-03-10 MED ORDER — ATORVASTATIN CALCIUM 40 MG PO TABS: 40.0000 mg | ORAL_TABLET | Freq: Every day | ORAL | 3 refills | Status: DC

## 2020-03-10 NOTE — Assessment & Plan Note (Signed)
BP at goal on lisinopril/hctz and amlodipine. Checking CMP and adjust as needed.

## 2020-03-10 NOTE — Assessment & Plan Note (Signed)
Checking lipid panel and adjust as needed atorvastatin 40 mg daily.

## 2020-03-10 NOTE — Assessment & Plan Note (Signed)
Flu shot declines. Covid-19 2 shots no booster strongly encouraged and given information about scheduling. Pneumonia given 23 today to complete series. Shingrix counseled. Tetanus due declines. Colonoscopy due referral to GI placed. Counseled about sun safety and mole surveillance. Counseled about the dangers of distracted driving. Given 10 year screening recommendations.

## 2020-03-10 NOTE — Assessment & Plan Note (Signed)
Controlled with diet. Checking HgA1c and adjust as needed. On ACE-I and statin.

## 2020-03-10 NOTE — Assessment & Plan Note (Signed)
Counseled to stop smoking and talked to him about CV risk benefit from stopping. Smoking about 1/2 pack per day or slightly less.

## 2020-03-10 NOTE — Progress Notes (Signed)
Subjective:   Patient ID: Tyler Klein, male    DOB: 12/29/1950, 70 y.o.   MRN: [id number]  HPI Here for medicare wellness and physical, no new complaints. Please see A/P for status and treatment of chronic medical problems.   Diet: DM since diabetic Physical activity: sedentary Depression/mood screen: negative Hearing: intact to whispered voice Visual acuity: grossly normal, overdue for annual eye exam  ADLs: capable Fall risk: none Home safety: good Cognitive evaluation: intact to orientation, naming, recall and repetition EOL planning: adv directives discussed  [place] Visit from 03/10/2020 in [place] at [facility] Total Score 0      I have personally reviewed and have noted 1. The patient's medical and social history - reviewed today no changes 2. Their use of alcohol, tobacco or illicit drugs 3. Their current medications and supplements 4. The patient's functional ability including ADL's, fall risks, home safety risks and hearing or visual impairment. 5. Diet and physical activities 6. Evidence for depression or mood disorders 7. Care team reviewed and updated 8.  The patient is on an opioid pain medication and this was reviewed with patient and non-opioid pain medication options were reviewed and offered to patient, their pain treatment plan and severity was discussed with them. We have considered referrals as appropriate for patient. Opioid risk factors were also considered and reviewed.   Patient Care Team: Hoyt Koch, MD as PCP - General (Internal Medicine) Past Medical History:  Diagnosis Date   Allergy    seasonal   HSV-1 (herpes simplex virus 1) infection    Hyperlipidemia    Hypertension    Past Surgical History:  Procedure Laterality Date   APPENDECTOMY     age [age]    LUNG SURGERY  2011   left lung thoracotomy (01/2006)   Family History  Problem Relation Age of Onset   Hypertension Son     Hypertension Sister    Hypertension Mother    Colon cancer Neg Hx    Colon polyps Neg Hx    Esophageal cancer Neg Hx    Prostate cancer Neg Hx    Rectal cancer Neg Hx    Stomach cancer Neg Hx    Review of Systems  Constitutional: Negative.   HENT: Negative.   Eyes: Negative.   Respiratory: Negative for cough, chest tightness and shortness of breath.   Cardiovascular: Negative for chest pain, palpitations and leg swelling.  Gastrointestinal: Negative for abdominal distention, abdominal pain, constipation, diarrhea, nausea and vomiting.  Musculoskeletal: Negative.   Skin: Negative.   Neurological: Negative.   Psychiatric/Behavioral: Negative.     Objective:  Physical Exam Constitutional:      Appearance: He is well-developed and well-nourished.  HENT:     Head: Normocephalic and atraumatic.  Eyes:     Extraocular Movements: EOM normal.  Cardiovascular:     Rate and Rhythm: Normal rate and regular rhythm.  Pulmonary:     Effort: Pulmonary effort is normal. No respiratory distress.     Breath sounds: Normal breath sounds. No wheezing or rales.  Abdominal:     General: Bowel sounds are normal. There is no distension.     Palpations: Abdomen is soft.     Tenderness: There is no abdominal tenderness. There is no rebound.  Musculoskeletal:        General: No edema.     Cervical back: Normal range of motion.  Skin:    General: Skin is warm and dry.  Neurological:     Mental Status: He is alert and oriented to person, place, and time.     Coordination: Coordination normal.  Psychiatric:        Mood and Affect: Mood and affect normal.     Vitals:   03/10/20 0802  BP: 122/80  Pulse: 79  Resp: 18  Temp: 98.3 F (36.8 C)  TempSrc: Oral  SpO2: 96%  Weight: 211 lb 3.2 oz (95.8 kg)  Height: 5' 6.5" (1.689 m)   This visit occurred during the SARS-CoV-2 public health emergency.  Safety protocols were in place, including screening questions prior to the visit,  additional usage of staff PPE, and extensive cleaning of exam room while observing appropriate contact time as indicated for disinfecting solutions.   Assessment & Plan:  Pneumonia 23 given at visit

## 2020-03-10 NOTE — Patient Instructions (Addendum)
Vaccines.gov to get the booster shot. You will get a call about scheduling your colonoscopy.    Health Maintenance, Male Adopting a healthy lifestyle and getting preventive care are important in promoting health and wellness. Ask your health care provider about:  The right schedule for you to have regular tests and exams.  Things you can do on your own to prevent diseases and keep yourself healthy. What should I know about diet, weight, and exercise? Eat a healthy diet  Eat a diet that includes plenty of vegetables, fruits, low-fat dairy products, and lean protein.  Do not eat a lot of foods that are high in solid fats, added sugars, or sodium.   Maintain a healthy weight Body mass index (BMI) is a measurement that can be used to identify possible weight problems. It estimates body fat based on height and weight. Your health care provider can help determine your BMI and help you achieve or maintain a healthy weight. Get regular exercise Get regular exercise. This is one of the most important things you can do for your health. Most adults should:  Exercise for at least 150 minutes each week. The exercise should increase your heart rate and make you sweat (moderate-intensity exercise).  Do strengthening exercises at least twice a week. This is in addition to the moderate-intensity exercise.  Spend less time sitting. Even light physical activity can be beneficial. Watch cholesterol and blood lipids Have your blood tested for lipids and cholesterol at 70 years of age, then have this test every 5 years. You may need to have your cholesterol levels checked more often if:  Your lipid or cholesterol levels are high.  You are older than 70 years of age.  You are at high risk for heart disease. What should I know about cancer screening? Many types of cancers can be detected early and may often be prevented. Depending on your health history and family history, you may need to have cancer  screening at various ages. This may include screening for:  Colorectal cancer.  Prostate cancer.  Skin cancer.  Lung cancer. What should I know about heart disease, diabetes, and high blood pressure? Blood pressure and heart disease  High blood pressure causes heart disease and increases the risk of stroke. This is more likely to develop in people who have high blood pressure readings, are of African descent, or are overweight.  Talk with your health care provider about your target blood pressure readings.  Have your blood pressure checked: ? Every 3-5 years if you are 33-22 years of age. ? Every year if you are 59 years old or older.  If you are between the ages of 40 and 18 and are a current or former smoker, ask your health care provider if you should have a one-time screening for abdominal aortic aneurysm (AAA). Diabetes Have regular diabetes screenings. This checks your fasting blood sugar level. Have the screening done:  Once every three years after age 70 if you are at a normal weight and have a low risk for diabetes.  More often and at a younger age if you are overweight or have a high risk for diabetes. What should I know about preventing infection? Hepatitis B If you have a higher risk for hepatitis B, you should be screened for this virus. Talk with your health care provider to find out if you are at risk for hepatitis B infection. Hepatitis C Blood testing is recommended for:  Everyone born from [birth year] through 1965.  Anyone with known risk factors for hepatitis C. Sexually transmitted infections (STIs)  You should be screened each year for STIs, including gonorrhea and chlamydia, if: ? You are sexually active and are younger than 70 years of age. ? You are older than 70 years of age and your health care provider tells you that you are at risk for this type of infection. ? Your sexual activity has changed since you were last screened, and you are at increased risk  for chlamydia or gonorrhea. Ask your health care provider if you are at risk.  Ask your health care provider about whether you are at high risk for HIV. Your health care provider may recommend a prescription medicine to help prevent HIV infection. If you choose to take medicine to prevent HIV, you should first get tested for HIV. You should then be tested every 3 months for as long as you are taking the medicine. Follow these instructions at home: Lifestyle  Do not use any products that contain nicotine or tobacco, such as cigarettes, e-cigarettes, and chewing tobacco. If you need help quitting, ask your health care provider.  Do not use street drugs.  Do not share needles.  Ask your health care provider for help if you need support or information about quitting drugs. Alcohol use  Do not drink alcohol if your health care provider tells you not to drink.  If you drink alcohol: ? Limit how much you have to 0-2 drinks a day. ? Be aware of how much alcohol is in your drink. In the [place] one drink equals one 12 oz bottle of beer (355 mL), one 5 oz glass of wine (148 mL), or one 1 oz glass of hard liquor (44 mL). General instructions  Schedule regular health, dental, and eye exams.  Stay current with your vaccines.  Tell your health care provider if: ? You often feel depressed. ? You have ever been abused or do not feel safe at home. Summary  Adopting a healthy lifestyle and getting preventive care are important in promoting health and wellness.  Follow your health care provider's instructions about healthy diet, exercising, and getting tested or screened for diseases.  Follow your health care provider's instructions on monitoring your cholesterol and blood pressure. This information is not intended to replace advice given to you by your health care provider. Make sure you discuss any questions you have with your health care provider. Document Revised: 12/18/2017 Document Reviewed:  12/18/2017 Elsevier Patient Education  2021 [employer].

## 2020-03-10 NOTE — Addendum Note (Signed)
Addended by: Thomes Cake on: 03/10/2020 08:43 AM   Modules accepted: Orders

## 2020-03-21 ENCOUNTER — Encounter: Payer: Self-pay | Admitting: Internal Medicine

## 2020-05-26 ENCOUNTER — Telehealth: Payer: Self-pay | Admitting: Internal Medicine

## 2020-05-26 NOTE — Progress Notes (Signed)
  Chronic Care Management   Note  05/26/2020 Name: KIYAAN HAQ MRN: [id number] DOB: 16-Dec-1950  DYLYN MCLAREN is a 70 y.o. year old male who is a primary care patient of Hoyt Koch, MD. I reached out to [place] Ma by phone today in response to a referral sent by Mr. Burman Riis Capriotti's PCP, Hoyt Koch, MD.   Mr. Entwistle was given information about Chronic Care Management services today including:  1. CCM service includes personalized support from designated clinical staff supervised by his physician, including individualized plan of care and coordination with other care providers 2. 24/7 contact phone numbers for assistance for urgent and routine care needs. 3. Service will only be billed when office clinical staff spend 20 minutes or more in a month to coordinate care. 4. Only one practitioner may furnish and bill the service in a calendar month. 5. The patient may stop CCM services at any time (effective at the end of the month) by phone call to the office staff.   Patient agreed to services and verbal consent obtained.   Follow up plan:   [place]

## 2020-06-13 NOTE — Progress Notes (Signed)
Chronic Care Management Pharmacy Note  06/14/2020 Name:  Tyler Klein MRN:  [id number] DOB:  04-07-1950  Subjective: Tyler Klein is an 70 y.o. year old male who is a primary patient of Hoyt Koch, MD.  The CCM team was consulted for assistance with disease management and care coordination needs.    Engaged with patient by telephone for initial visit in response to provider referral for pharmacy case management and/or care coordination services.   Consent to Services:  The patient was given the following information about Chronic Care Management services today, agreed to services, and gave verbal consent: 1. CCM service includes personalized support from designated clinical staff supervised by the primary care provider, including individualized plan of care and coordination with other care providers 2. 24/7 contact phone numbers for assistance for urgent and routine care needs. 3. Service will only be billed when office clinical staff spend 20 minutes or more in a month to coordinate care. 4. Only one practitioner may furnish and bill the service in a calendar month. 5.The patient may stop CCM services at any time (effective at the end of the month) by phone call to the office staff. 6. The patient will be responsible for cost sharing (co-pay) of up to 20% of the service fee (after annual deductible is met). Patient agreed to services and consent obtained.  Patient Care Team: Hoyt Koch, MD as PCP - General (Internal Medicine) Tomasa Blase, [facility] as Pharmacist (Pharmacist)  Recent office visits: 03/10/2020 - PCP visit for medicare wellness visit, HTN controlled, smoking cessation advised   Recent consult visits: [facility] visits: None in previous 6 months  Objective:  Lab Results  Component Value Date   CREATININE 1.59 (H) 03/10/2020   BUN 25 (H) 03/10/2020   GFR 44.08 (L) 03/10/2020   GFRNONAA 51 (L) 05/17/2016   GFRAA 59 (L) 05/17/2016   NA 137  03/10/2020   K 3.6 03/10/2020   CALCIUM 9.9 03/10/2020   CO2 31 03/10/2020   GLUCOSE 141 (H) 03/10/2020    Lab Results  Component Value Date/Time   HGBA1C 6.5 03/10/2020 08:35 AM   HGBA1C 6.3 08/07/2018 01:11 PM   GFR 44.08 (L) 03/10/2020 08:35 AM   GFR 57.69 (L) 08/07/2018 01:11 PM    Last diabetic Eye exam:  No results found for: HMDIABEYEEXA  Last diabetic Foot exam:  No results found for: HMDIABFOOTEX   Lab Results  Component Value Date   CHOL 284 (H) 03/10/2020   HDL 34.10 (L) 03/10/2020   LDLCALC 104 (H) 08/07/2018   LDLDIRECT 166.0 03/10/2020   TRIG 338.0 (H) 03/10/2020   CHOLHDL 8 03/10/2020    Hepatic Function Latest Ref Rng & Units 03/10/2020 08/07/2018 11/29/2017  Total Protein 6.0 - 8.3 g/dL 7.8 7.7 7.5  Albumin 3.5 - 5.2 g/dL 3.8 4.1 3.8  AST 0 - 37 U/L '20 21 19  ' ALT 0 - 53 U/L '26 25 25  ' Alk Phosphatase 39 - 117 U/L 65 73 81  Total Bilirubin 0.2 - 1.2 mg/dL 0.5 0.6 0.4    No results found for: TSH, FREET4  CBC Latest Ref Rng & Units 03/10/2020 11/29/2017 11/01/2016  WBC 4.0 - 10.5 K/uL 7.7 7.0 7.0  Hemoglobin 13.0 - 17.0 g/dL 15.0 14.2 14.8  Hematocrit 39.0 - 52.0 % 45.5 42.9 45.8  Platelets 150.0 - 400.0 K/uL 244.0 259.0 270.0    No results found for: VD25OH  Clinical ASCVD: No  The 10-year ASCVD risk score (  Renaye Rakers., et al., 2013) is: 53.5%   Values used to calculate the score:     Age: 26 years     Sex: Male     Is Non-Hispanic African American: Yes     Diabetic: Yes     Tobacco smoker: Yes     Systolic Blood Pressure: 902 mmHg     Is BP treated: Yes     HDL Cholesterol: 34.1 mg/dL     Total Cholesterol: 284 mg/dL    Depression screen [facility] 2/9 03/10/2020 11/29/2017 11/01/2016  Decreased Interest 0 0 0  Down, Depressed, Hopeless 0 0 0  PHQ - 2 Score 0 0 0       Social History   Tobacco Use  Smoking Status Current Some Day Smoker  . Packs/day: 0.50  . Types: Cigarettes  Smokeless Tobacco Never Used   BP Readings from Last 3  Encounters:  03/10/20 122/80  08/07/18 130/82  11/29/17 130/80   Pulse Readings from Last 3 Encounters:  03/10/20 79  08/07/18 68  11/29/17 76   Wt Readings from Last 3 Encounters:  03/10/20 211 lb 3.2 oz (95.8 kg)  08/07/18 207 lb (93.9 kg)  11/29/17 208 lb (94.3 kg)   BMI Readings from Last 3 Encounters:  03/10/20 33.58 kg/m  08/07/18 32.91 kg/m  11/29/17 33.07 kg/m    Assessment/Interventions: Review of patient past medical history, allergies, medications, health status, including review of consultants reports, laboratory and other test data, was performed as part of comprehensive evaluation and provision of chronic care management services.   SDOH:  (Social Determinants of Health) assessments and interventions performed: Yes  SDOH Screenings   Alcohol Screen: Not on file  Depression (PHQ2-9): Low Risk   . PHQ-2 Score: 0  Financial Resource Strain: Low Risk   . Difficulty of Paying Living Expenses: Not hard at all  Food Insecurity: Not on file  Housing: Not on file  Physical Activity: Not on file  Social Connections: Not on file  Stress: Not on file  Tobacco Use: High Risk  . Smoking Tobacco Use: Current Some Day Smoker  . Smokeless Tobacco Use: Never Used  Transportation Needs: Not on file    [place]  No Known Allergies  Medications Reviewed Today    Reviewed by Tomasa Blase, [facility] (Pharmacist) on 06/14/20 at 35  Med List Status: <None>  Medication Order Taking? Sig Documenting Provider Last Dose Status Informant  amLODipine (NORVASC) 10 MG tablet [id number] Yes Take 1 tablet (10 mg total) by mouth daily. Hoyt Koch, MD Taking Active   atorvastatin (LIPITOR) 40 MG tablet [id number] Yes Take 1 tablet (40 mg total) by mouth daily. Hoyt Koch, MD Taking Active   lisinopril-hydrochlorothiazide (ZESTORETIC) 20-12.5 MG tablet [id number] Yes Take 1 tablet by mouth daily. Hoyt Koch, MD Taking Active   Multiple Vitamin  (MULTIVITAMIN) tablet [id number] Yes Take 1 tablet by mouth once a week. To twice weekly [provider] Taking Active   Naproxen Sodium (ALEVE PO) [id number] Yes Take by mouth. As needed [provider] Taking Active           Patient Active Problem List   Diagnosis Date Noted  . Type 2 diabetes mellitus with hyperlipidemia ([place]) 03/21/2017  . Routine general medical examination at a health care facility 11/01/2016  . Proteinuria 02/13/2015  . Snoring 02/18/2013  . Hyperlipidemia associated with type 2 diabetes mellitus ([place]) 02/04/2006  . HYPERCALCEMIA 02/04/2006  . ANEMIA-NOS 02/04/2006  .  ERECTILE DYSFUNCTION 02/04/2006  . TOBACCO ABUSE 02/04/2006  . Essential hypertension 02/04/2006  . PULMONARY DISEASE 02/04/2006  . RENAL INSUFFICIENCY, CHRONIC 02/04/2006    Immunization History  Administered Date(s) Administered  . PFIZER(Purple Top)SARS-COV-2 Vaccination 03/20/2019, 04/10/2019  . Pneumococcal Conjugate-13 11/01/2016  . Pneumococcal Polysaccharide-23 03/10/2020  . Tdap 04/18/2007    Conditions to be addressed/monitored:  Hypertension, Hyperlipidemia and Diabetes  Care Plan : CCM Care Plan  Updates made by Tomasa Blase, RPH since 06/14/2020 12:00 AM    Problem: HLD, HTN, Prediabetes, Smoking Cessation   Priority: High  Onset Date: 06/14/2020    Long-Range Goal: Disease Management   Start Date: 06/14/2020  Expected End Date: 12/14/2020  This Visit's Progress: On track  Priority: High  Note:   Current Barriers:  . Unable to independently monitor therapeutic efficacy . Unable to achieve control of LDL / smoking cessation    Pharmacist Clinical Goal(s):  Marland Kitchen Patient will achieve adherence to monitoring guidelines and medication adherence to achieve therapeutic efficacy . achieve control of LDL  as evidenced by next lipid panel . maintain control of BP as evidenced by BP logs   through collaboration with PharmD and provider.   Interventions: . 1:1  collaboration with Hoyt Koch, MD regarding development and update of comprehensive plan of care as evidenced by provider attestation and co-signature . Inter-disciplinary care team collaboration (see longitudinal plan of care) . Comprehensive medication review performed; medication list updated in electronic medical record  Hypertension (BP goal <130/80) -Controlled -Current treatment:  . Amlodipine 20m daily  . Lisinopril-HCTZ 20-12.522mdaily  -Medications previously tried: n/a  -Current home readings: only checks at home if he is feeling different - last office visit was 122/80 pulse of 79 -Current dietary habits: reports that he does not use salt, limits fried foods  -Current exercise habits: walking daily / jumping jacks / light weight exercise at time  -Denies hypotensive/hypertensive symptoms -Educated on BP goals and benefits of medications for prevention of heart attack, stroke and kidney damage; Daily salt intake goal < 2300 mg; Exercise goal of 150 minutes per week; Importance of home blood pressure monitoring; -Counseled to monitor BP at home monthyl, document, and provide log at future appointments -Counseled on diet and exercise extensively Recommended to continue current medication  Hyperlipidemia: (LDL goal < 100) -Uncontrolled  - Last LDL 166 mg/dL (03/10/2020) -Current treatment: . Atorvastatin 4064maily - notes that at times he misses doses of this medication  -Medications previously tried: n/a  -Current dietary patterns: reports that he does not eat as many fried foods as he previously had, eating more vegetables  -Current exercise habits: walking daily / jumping jacks / light weight exercise at time  -Educated on Cholesterol goals;  Benefits of statin for ASCVD risk reduction; Importance of limiting foods high in cholesterol; Exercise goal of 150 minutes per week; importance of medication adherence  -Counseled on diet and exercise  extensively Recommended to continue current medication  Prediabetes (A1c goal <7%) -Controlled (diet) - Last A1c 6.5% (03/10/2020) -Current medications: . N/a  -Medications previously tried: n/a  -Denies hypoglycemic/hyperglycemic symptoms -Current meal patterns:  . breakfast: does not typically eat   . lunch: does not typically eat   . dinner: sandwich / french fries / shrimp / fish / chicken . snacks: chips  . drinks: green tea, ginger ale, sprite  -Current exercise: walking daily / jumping jacks / light weight exercise at time  -Educated on A1c and blood sugar goals; Complications  of diabetes including kidney damage, retinal damage, and cardiovascular disease; Exercise goal of 150 minutes per week; Benefits of weight loss; -Counseled to check feet daily and get yearly eye exams -Counseled on diet and exercise extensively  Tobacco use (Goal Smoking Cessation) -Not ideally controlled -Previous quit attempts: previously had quit smoking for 5 years, notes to increase in weight after quitting  -Current treatment  . n/a -Patient smokes After 30 minutes of waking -Patient triggers include: finishing a meal and taking a work break -On a scale of 1-10, reports MOTIVATION to quit is 4 -On a scale of 1-10, reports CONFIDENCE in quitting is 10 -patient reports that he feels that he could stop smoking at any time if he wanted to, encouraged patient to establish a quit date before next follow up -Recommended patient to set quit date / reach out to office once quit date has been decided - has successfully quit smoking in the past without use of medication or NRT, feels he would be able to do again without   Health Maintenance -Current therapy:  . Naproxen 242m - 2 times daily as needed  . Multivitamin once to twice weekly (feels that taking more often increases his appetite)  -Educated on Cost vs benefit of each product must be carefully weighed by individual consumer -Patient is  satisfied with current therapy and denies issues -Recommended to continue current medication  Patient Goals/Self-Care Activities . Patient will:  - take medications as prescribed focus on medication adherence by reaching out to office before running out of medication  check blood pressure monthly, document, and provide at future appointments target a minimum of 150 minutes of moderate intensity exercise weekly  Follow Up Plan: Telephone follow up appointment with care management team member scheduled for: The patient has been provided with contact information for the care management team and has been advised to call with any health related questions or concerns.       Medication Assistance: None required.  Patient affirms current coverage meets needs.  [facility] [id number]: [phone]: [phone]  Uses pill box? No Pt endorses 85-90% compliance  Care Plan and Follow Up Patient Decision:  Patient agrees to Care Plan and Follow-up.  Plan: Telephone follow up appointment with care management team member scheduled for:  6 months  and The patient has been provided with contact information for the care management team and has been advised to call with any health related questions or concerns.   Patient will be adherent to prescribed medications, will plan to recheck lipid panel with next PCP appointment to assess LDL control with consistent atorvastatin use.  Patient will also set a quit date for tobacco use before next appointment   DTomasa Blase PharmD Clinical Pharmacist, [facility]. [place] 06/14/20

## 2020-06-14 ENCOUNTER — Other Ambulatory Visit: Payer: Self-pay

## 2020-06-14 ENCOUNTER — Ambulatory Visit (INDEPENDENT_AMBULATORY_CARE_PROVIDER_SITE_OTHER): Payer: Medicare HMO

## 2020-06-14 DIAGNOSIS — E1169 Type 2 diabetes mellitus with other specified complication: Secondary | ICD-10-CM | POA: Diagnosis not present

## 2020-06-14 DIAGNOSIS — I10 Essential (primary) hypertension: Secondary | ICD-10-CM

## 2020-06-14 DIAGNOSIS — E785 Hyperlipidemia, unspecified: Secondary | ICD-10-CM

## 2020-06-14 DIAGNOSIS — F17200 Nicotine dependence, unspecified, uncomplicated: Secondary | ICD-10-CM

## 2020-06-14 NOTE — Patient Instructions (Addendum)
Visit Information   PATIENT GOALS:  Goals Addressed            This Visit's Progress   . Manage My Medicine       Timeframe:  Long-Range Goal Priority:  High Start Date:  06/14/2020                           Expected End Date:    12/14/2020                   Follow Up Date 12/14/2020   - call for medicine refill 2 or 3 days before it runs out    Why is this important?   . These steps will help you keep on track with your medicines. . Taking atorvastatin as prescribed without missing dosing can lower LDL to goal   Notes: Patient will reach out to office if he has run out of medication, agreeable to be as consistent with taking atorvastatin as he is with taking amlodipine and lisinipril-HCTZ    . Track and Manage My Blood Pressure-Hypertension       Timeframe:  Long-Range Goal Priority:  Medium Start Date:   06/14/2020                          Expected End Date:   12/14/2020                   Follow Up Date 12/14/2020    - write blood pressure results in a log or diary - Check blood pressure at least once monthly     Why is this important?    You won't feel high blood pressure, but it can still hurt your blood vessels.   High blood pressure can cause heart or kidney problems. It can also cause a stroke.   Making lifestyle changes like losing a little weight or eating less salt will help.   Checking your blood pressure at home and at different times of the day can help to control blood pressure.   If the doctor prescribes medicine remember to take it the way the doctor ordered.   Call the office if you cannot afford the medicine or if there are questions about it.     Notes: Patient will reach out should blood pressure average above goal (>130/80)       Consent to CCM Services: Mr. Zielinski was given information about Chronic Care Management services today including:  1. CCM service includes personalized support from designated clinical staff supervised by his physician,  including individualized plan of care and coordination with other care providers 2. 24/7 contact phone numbers for assistance for urgent and routine care needs. 3. Service will only be billed when office clinical staff spend 20 minutes or more in a month to coordinate care. 4. Only one practitioner may furnish and bill the service in a calendar month. 5. The patient may stop CCM services at any time (effective at the end of the month) by phone call to the office staff. 6. The patient will be responsible for cost sharing (co-pay) of up to 20% of the service fee (after annual deductible is met).  Patient agreed to services and verbal consent obtained.   Patient verbalizes understanding of instructions provided today and agrees to view in [place].   Telephone follow up appointment with care management team member scheduled for: The patient has been provided with  contact information for the care management team and has been advised to call with any health related questions or concerns.   Tomasa Blase, PharmD Clinical Pharmacist, [place]       CLINICAL CARE PLAN: Patient Care Plan: CCM Care Plan    Problem Identified: HLD, HTN, Prediabetes, Smoking Cessation   Priority: High  Onset Date: 06/14/2020    Long-Range Goal: Disease Management   Start Date: 06/14/2020  Expected End Date: 12/14/2020  This Visit's Progress: On track  Priority: High  Note:   Current Barriers:  . Unable to independently monitor therapeutic efficacy . Unable to achieve control of LDL / smoking cessation    Pharmacist Clinical Goal(s):  Marland Kitchen Patient will achieve adherence to monitoring guidelines and medication adherence to achieve therapeutic efficacy . achieve control of LDL  as evidenced by next lipid panel . maintain control of BP as evidenced by BP logs   through collaboration with PharmD and provider.   Interventions: . 1:1 collaboration with Hoyt Koch, MD regarding development and  update of comprehensive plan of care as evidenced by provider attestation and co-signature . Inter-disciplinary care team collaboration (see longitudinal plan of care) . Comprehensive medication review performed; medication list updated in electronic medical record  Hypertension (BP goal <130/80) -Controlled -Current treatment:  . Amlodipine 76m daily  . Lisinopril-HCTZ 20-12.519mdaily  -Medications previously tried: n/a  -Current home readings: only checks at home if he is feeling different - last office visit was 122/80 pulse of 79 -Current dietary habits: reports that he does not use salt, limits fried foods  -Current exercise habits: walking daily / jumping jacks / light weight exercise at time  -Denies hypotensive/hypertensive symptoms -Educated on BP goals and benefits of medications for prevention of heart attack, stroke and kidney damage; Daily salt intake goal < 2300 mg; Exercise goal of 150 minutes per week; Importance of home blood pressure monitoring; -Counseled to monitor BP at home monthyl, document, and provide log at future appointments -Counseled on diet and exercise extensively Recommended to continue current medication  Hyperlipidemia: (LDL goal < 100) -Uncontrolled  - Last LDL 166 mg/dL (03/10/2020) -Current treatment: . Atorvastatin 4066maily - notes that at times he misses doses of this medication  -Medications previously tried: n/a  -Current dietary patterns: reports that he does not eat as many fried foods as he previously had, eating more vegetables  -Current exercise habits: walking daily / jumping jacks / light weight exercise at time  -Educated on Cholesterol goals;  Benefits of statin for ASCVD risk reduction; Importance of limiting foods high in cholesterol; Exercise goal of 150 minutes per week; importance of medication adherence  -Counseled on diet and exercise extensively Recommended to continue current medication  Prediabetes (A1c goal  <7%) -Controlled (diet) - Last A1c 6.5% (03/10/2020) -Current medications: . N/a  -Medications previously tried: n/a  -Denies hypoglycemic/hyperglycemic symptoms -Current meal patterns:  . breakfast: does not typically eat   . lunch: does not typically eat   . dinner: sandwich / french fries / shrimp / fish / chicken . snacks: chips  . drinks: green tea, ginger ale, sprite  -Current exercise: walking daily / jumping jacks / light weight exercise at time  -Educated on A1c and blood sugar goals; Complications of diabetes including kidney damage, retinal damage, and cardiovascular disease; Exercise goal of 150 minutes per week; Benefits of weight loss; -Counseled to check feet daily and get yearly eye exams -Counseled on diet and exercise extensively  Tobacco use (Goal Smoking Cessation) -Not ideally controlled -Previous quit attempts: previously had quit smoking for 5 years, notes to increase in weight after quitting  -Current treatment  . n/a -Patient smokes After 30 minutes of waking -Patient triggers include: finishing a meal and taking a work break -On a scale of 1-10, reports MOTIVATION to quit is 4 -On a scale of 1-10, reports CONFIDENCE in quitting is 10 -patient reports that he feels that he could stop smoking at any time if he wanted to, encouraged patient to establish a quit date before next follow up -Recommended patient to set quit date / reach out to office once quit date has been decided - has successfully quit smoking in the past without use of medication or NRT, feels he would be able to do again without   Health Maintenance -Current therapy:  . Naproxen $RemoveBe'220mg'sUzWIoppc$  - 2 times daily as needed  . Multivitamin once to twice weekly (feels that taking more often increases his appetite)  -Educated on Cost vs benefit of each product must be carefully weighed by individual consumer -Patient is satisfied with current therapy and denies issues -Recommended to continue current  medication  Patient Goals/Self-Care Activities . Patient will:  - take medications as prescribed focus on medication adherence by reaching out to office before running out of medication  check blood pressure monthly, document, and provide at future appointments target a minimum of 150 minutes of moderate intensity exercise weekly  Follow Up Plan: Telephone follow up appointment with care management team member scheduled for: The patient has been provided with contact information for the care management team and has been advised to call with any health related questions or concerns.        American Journal of Respiratory and [place], [phone]), e5-e31. http://knight.com/.300762-2633HL">  Health Risks of Smoking Smoking tobacco is very bad for your health. Tobacco smoke contains many toxic chemicals that can damage every part of your body. Secondhand smoke can be harmful to those around you. Tobacco or nicotine use can cause many long-term (chronic) diseases. Smoking is difficult to quit because a chemical in tobacco, called nicotine, causes addiction or dependence. When you smoke and inhale, nicotine is absorbed quickly into the bloodstream through your lungs. Both inhaled and non-inhaled nicotine may be addictive. How can quitting affect me? There are health benefits of quitting smoking. Some benefits happen right away and others take time. Benefits may include:  Blood flow, blood pressure, heart rate, and lung capacity may begin to improve. However, any lung damage that has already occurred cannot be repaired.  Temporary respiratory symptoms, such as nasal congestion and cough, may improve over time.  Your risk of heart disease, stroke, and cancer is reduced.  The overall quality of your health may improve.  You may save money, as you will not spend money on tobacco products and may spend less money on smoking-related health issues. What can increase my  risk? Smoking harms nearly every organ in the body. People who smoke tobacco have a shorter life expectancy and an increased risk of many serious medical problems. These include:  More respiratory infections, such as colds and pneumonia.  Cancer.  Heart disease.  Stroke.  Chronic respiratory diseases.  Delayed wound healing and increased risk of complications during surgery.  Problems with reproduction, pregnancy, and childbirth, such as infertility, early (premature) births, stillbirths, and birth defects. Secondhand smoke exposure to children increases the risk of:  Sudden infant death syndrome (SIDS).  Infections in  the nose, throat, or airways (respiratory infections).  Chronic respiratory symptoms.   What actions can I take to quit? Smoking is an addiction that affects both your body and your mind, and long-time habits can be hard to change. Your health care provider can recommend:  Nicotine replacement products, such as patches, gum, and nasal sprays. Use these products only as directed. Do not replace cigarette smoking with electronic cigarettes, which are commonly called e-cigarettes. The safety of e-cigarettes is not known, and some may contain harmful chemicals.  Programs and community resources, which may include group support, education, or talk therapy.  Prescription medicines to help reduce cravings.  A combination of two or more quit methods, which will increase the success of quitting.   Where to find support Follow the recommendations from your health care provider about support groups and other assistance. You can also visit:  [employer]: www.naquitline.org or call 1-800-QUIT-NOW.  U.S. Department of Health and Human Services: www.smokefree.gov  American Lung Association: www.freedomfromsmoking.org  American Heart Association: www.heart.org Where to find more information  Centers for Disease Control and Prevention:  http://www.wolf.info/  World Health Organization: RoleLink.com.br Summary  Smoking tobacco is very bad for your health. Tobacco smoke contains many toxic chemicals that can damage every part of the body.  Smoking is difficult to quit because a chemical in tobacco, called nicotine, causes addiction or dependence.  There are immediate and long-term health benefits of quitting smoking.  A combination of two or more quit methods increases the success of quitting. This information is not intended to replace advice given to you by your health care provider. Make sure you discuss any questions you have with your health care provider. Document Revised: 02/09/2019 Document Reviewed: 02/09/2019 Elsevier Patient Education  2021 [place].  High Cholesterol  High cholesterol is a condition in which the blood has high levels of a white, waxy substance similar to fat (cholesterol). The liver makes all the cholesterol that the body needs. The human body needs small amounts of cholesterol to help build cells. A person gets extra or excess cholesterol from the food that he or she eats. The blood carries cholesterol from the liver to the rest of the body. If you have high cholesterol, deposits (plaques) may build up on the walls of your arteries. Arteries are the blood vessels that carry blood away from your heart. These plaques make the arteries narrow and stiff. Cholesterol plaques increase your risk for heart attack and stroke. Work with your health care provider to keep your cholesterol levels in a healthy range. What increases the risk? The following factors may make you more likely to develop this condition:  Eating foods that are high in animal fat (saturated fat) or cholesterol.  Being overweight.  Not getting enough exercise.  A family history of high cholesterol (familial hypercholesterolemia).  Use of tobacco products.  Having diabetes. What are the signs or symptoms? There are no symptoms of this  condition. How is this diagnosed? This condition may be diagnosed based on the results of a blood test.  If you are older than 70 years of age, your health care provider may check your cholesterol levels every 4-6 years.  You may be checked more often if you have high cholesterol or other risk factors for heart disease. The blood test for cholesterol measures:  "Bad" cholesterol, or LDL cholesterol. This is the main type of cholesterol that causes heart disease. The desired level is less than 100 mg/dL.  "Good"  cholesterol, or HDL cholesterol. HDL helps protect against heart disease by cleaning the arteries and carrying the LDL to the liver for processing. The desired level for HDL is 60 mg/dL or higher.  Triglycerides. These are fats that your body can store or burn for energy. The desired level is less than 150 mg/dL.  Total cholesterol. This measures the total amount of cholesterol in your blood and includes LDL, HDL, and triglycerides. The desired level is less than 200 mg/dL. How is this treated? This condition may be treated with:  Diet changes. You may be asked to eat foods that have more fiber and less saturated fats or added sugar.  Lifestyle changes. These may include regular exercise, maintaining a healthy weight, and quitting use of tobacco products.  Medicines. These are given when diet and lifestyle changes have not worked. You may be prescribed a statin medicine to help lower your cholesterol levels. Follow these instructions at home: Eating and drinking  Eat a healthy, balanced diet. This diet includes: ? Daily servings of a variety of fresh, frozen, or canned fruits and vegetables. ? Daily servings of whole grain foods that are rich in fiber. ? Foods that are low in saturated fats and trans fats. These include poultry and fish without skin, lean cuts of meat, and low-fat dairy products. ? A variety of fish, especially oily fish that contain omega-3 fatty acids. Aim to  eat fish at least 2 times a week.  Avoid foods and drinks that have added sugar.  Use healthy cooking methods, such as roasting, grilling, broiling, baking, poaching, steaming, and stir-frying. Do not fry your food except for stir-frying.   Lifestyle  Get regular exercise. Aim to exercise for a total of 150 minutes a week. Increase your activity level by doing activities such as gardening, walking, and taking the stairs.  Do not use any products that contain nicotine or tobacco, such as cigarettes, e-cigarettes, and chewing tobacco. If you need help quitting, ask your health care provider.   General instructions  Take over-the-counter and prescription medicines only as told by your health care provider.  Keep all follow-up visits as told by your health care provider. This is important. Where to find more information  American Heart Association: www.heart.org  National Heart, Lung, and Blood Institute: https://wilson-eaton.com/ Contact a health care provider if:  You have trouble achieving or maintaining a healthy diet or weight.  You are starting an exercise program.  You are unable to stop smoking. Get help right away if:  You have chest pain.  You have trouble breathing.  You have any symptoms of a stroke. "BE FAST" is an easy way to remember the main warning signs of a stroke: ? B - Balance. Signs are dizziness, sudden trouble walking, or loss of balance. ? E - Eyes. Signs are trouble seeing or a sudden change in vision. ? F - Face. Signs are sudden weakness or numbness of the face, or the face or eyelid drooping on one side. ? A - Arms. Signs are weakness or numbness in an arm. This happens suddenly and usually on one side of the body. ? S - Speech. Signs are sudden trouble speaking, slurred speech, or trouble understanding what people say. ? T - Time. Time to call emergency services. Write down what time symptoms started.  You have other signs of a stroke, such as: ? A sudden,  severe headache with no known cause. ? Nausea or vomiting. ? Seizure. These symptoms may represent a  serious problem that is an emergency. Do not wait to see if the symptoms will go away. Get medical help right away. Call your local emergency services (911 in the [place] Do not drive yourself to the hospital. Summary  Cholesterol plaques increase your risk for heart attack and stroke. Work with your health care provider to keep your cholesterol levels in a healthy range.  Eat a healthy, balanced diet, get regular exercise, and maintain a healthy weight.  Do not use any products that contain nicotine or tobacco, such as cigarettes, e-cigarettes, and chewing tobacco.  Get help right away if you have any symptoms of a stroke. This information is not intended to replace advice given to you by your health care provider. Make sure you discuss any questions you have with your health care provider. Document Revised: 11/24/2018 Document Reviewed: 11/24/2018 Elsevier Patient Education  2021 [employer].

## 2020-10-11 ENCOUNTER — Telehealth: Payer: Self-pay

## 2020-10-11 NOTE — Telephone Encounter (Signed)
Provider has a meeting and wont be in office until 8:30. Pt has agreed to come in around 8:20 to be seen.

## 2020-10-12 ENCOUNTER — Encounter: Payer: Self-pay | Admitting: Internal Medicine

## 2020-10-12 ENCOUNTER — Ambulatory Visit (INDEPENDENT_AMBULATORY_CARE_PROVIDER_SITE_OTHER): Payer: Medicare HMO | Admitting: Internal Medicine

## 2020-10-12 ENCOUNTER — Other Ambulatory Visit: Payer: Self-pay

## 2020-10-12 VITALS — BP 134/74 | HR 70 | Temp 98.5°F | Resp 18 | Ht 66.5 in | Wt 213.4 lb

## 2020-10-12 DIAGNOSIS — E1169 Type 2 diabetes mellitus with other specified complication: Secondary | ICD-10-CM | POA: Diagnosis not present

## 2020-10-12 DIAGNOSIS — E785 Hyperlipidemia, unspecified: Secondary | ICD-10-CM | POA: Diagnosis not present

## 2020-10-12 DIAGNOSIS — I10 Essential (primary) hypertension: Secondary | ICD-10-CM | POA: Diagnosis not present

## 2020-10-12 DIAGNOSIS — K635 Polyp of colon: Secondary | ICD-10-CM | POA: Diagnosis not present

## 2020-10-12 LAB — COMPREHENSIVE METABOLIC PANEL
ALT: 28 U/L (ref 0–53)
AST: 21 U/L (ref 0–37)
Albumin: 3.9 g/dL (ref 3.5–5.2)
Alkaline Phosphatase: 58 U/L (ref 39–117)
BUN: 22 mg/dL (ref 6–23)
CO2: 32 mEq/L (ref 19–32)
Calcium: 9.9 mg/dL (ref 8.4–10.5)
Chloride: 102 mEq/L (ref 96–112)
Creatinine, Ser: 1.66 mg/dL — ABNORMAL HIGH (ref 0.40–1.50)
GFR: 41.68 mL/min — ABNORMAL LOW (ref >60.00)
Glucose, Bld: 107 mg/dL — ABNORMAL HIGH (ref 70–99)
Potassium: 3.9 mEq/L (ref 3.5–5.1)
Sodium: 140 mEq/L (ref 135–145)
Total Bilirubin: 0.5 mg/dL (ref 0.2–1.2)
Total Protein: 7.6 g/dL (ref 6.0–8.3)

## 2020-10-12 LAB — LIPID PANEL
Cholesterol: 218 mg/dL — ABNORMAL HIGH (ref 0–200)
HDL: 39.3 mg/dL (ref >39.00)
LDL Cholesterol: 141 mg/dL — ABNORMAL HIGH (ref 0–99)
NonHDL: 178.73
Total CHOL/HDL Ratio: 6
Triglycerides: 191 mg/dL — ABNORMAL HIGH (ref 0.0–149.0)
VLDL: 38.2 mg/dL (ref 0.0–40.0)

## 2020-10-12 LAB — HEMOGLOBIN A1C: Hgb A1c MFr Bld: 6.7 % — ABNORMAL HIGH (ref 4.6–6.5)

## 2020-10-12 NOTE — Assessment & Plan Note (Signed)
Checking lipid panel and has started lipitor 40 mg daily since last visit. Goal LDL <100, <70 ideal. Adjust dosing as needed.

## 2020-10-12 NOTE — Progress Notes (Signed)
   Subjective:   Patient ID: Tyler Klein, male    DOB: [birth year], 70 y.o.   MRN: [id number]  HPI The patient is a 70 YO man coming in for follow up medical conditions.   Review of Systems  Constitutional: Negative.   HENT: Negative.    Eyes: Negative.   Respiratory:  Negative for cough, chest tightness and shortness of breath.   Cardiovascular:  Negative for chest pain, palpitations and leg swelling.  Gastrointestinal:  Negative for abdominal distention, abdominal pain, constipation, diarrhea, nausea and vomiting.  Musculoskeletal: Negative.   Skin: Negative.   Neurological: Negative.   Psychiatric/Behavioral: Negative.     Objective:  Physical Exam Constitutional:      Appearance: He is well-developed. He is obese.  HENT:     Head: Normocephalic and atraumatic.  Cardiovascular:     Rate and Rhythm: Normal rate and regular rhythm.  Pulmonary:     Effort: Pulmonary effort is normal. No respiratory distress.     Breath sounds: Normal breath sounds. No wheezing or rales.  Abdominal:     General: Bowel sounds are normal. There is no distension.     Palpations: Abdomen is soft.     Tenderness: There is no abdominal tenderness. There is no rebound.  Musculoskeletal:     Cervical back: Normal range of motion.  Skin:    General: Skin is warm and dry.  Neurological:     Mental Status: He is alert and oriented to person, place, and time.     Coordination: Coordination normal.    Vitals:   10/12/20 0838  BP: 134/74  Pulse: 70  Resp: 18  Temp: 98.5 F (36.9 C)  TempSrc: Oral  SpO2: 96%  Weight: 213 lb 6.4 oz (96.8 kg)  Height: 5' 6.5" (1.689 m)    This visit occurred during the SARS-CoV-2 public health emergency.  Safety protocols were in place, including screening questions prior to the visit, additional usage of staff PPE, and extensive cleaning of exam room while observing appropriate contact time as indicated for disinfecting solutions.   Assessment & Plan:

## 2020-10-12 NOTE — Assessment & Plan Note (Signed)
Checking HgA1c today and prior 6.5 diet controlled. Is on ACE-I and statin.

## 2020-10-12 NOTE — Patient Instructions (Signed)
We are checking the labs today and will let you know about the sugars.

## 2020-10-12 NOTE — Assessment & Plan Note (Signed)
BP at goal back on medications and checking CMP and adjust as needed.

## 2020-10-12 NOTE — Assessment & Plan Note (Signed)
Overdue for repeat colonoscopy and he is willing to proceed. Referral to GI placed.

## 2020-10-21 ENCOUNTER — Other Ambulatory Visit: Payer: Self-pay | Admitting: Internal Medicine

## 2020-10-21 DIAGNOSIS — E1169 Type 2 diabetes mellitus with other specified complication: Secondary | ICD-10-CM

## 2020-10-21 DIAGNOSIS — E785 Hyperlipidemia, unspecified: Secondary | ICD-10-CM

## 2020-10-21 MED ORDER — ATORVASTATIN CALCIUM 80 MG PO TABS: 80.0000 mg | ORAL_TABLET | Freq: Every day | ORAL | 3 refills | Status: DC

## 2020-12-13 ENCOUNTER — Telehealth: Payer: Medicare HMO

## 2020-12-15 ENCOUNTER — Telehealth: Payer: Medicare HMO

## 2020-12-15 NOTE — Progress Notes (Deleted)
Chronic Care Management Pharmacy Note  12/15/2020 Name:  Tyler Klein MRN:  [id number] DOB:  1950-01-24  Subjective: ARSHAWN Klein is an 70 y.o. year old male who is a primary patient of Tyler Koch, MD.  The CCM team was consulted for assistance with disease management and care coordination needs.    Engaged with patient by telephone for follow up visit in response to provider referral for pharmacy case management and/or care coordination services.   Consent to Services:  The patient was given the following information about Chronic Care Management services today, agreed to services, and gave verbal consent: 1. CCM service includes personalized support from designated clinical staff supervised by the primary care provider, including individualized plan of care and coordination with other care providers 2. 24/7 contact phone numbers for assistance for urgent and routine care needs. 3. Service will only be billed when office clinical staff spend 20 minutes or more in a month to coordinate care. 4. Only one practitioner may furnish and bill the service in a calendar month. 5.The patient may stop CCM services at any time (effective at the end of the month) by phone call to the office staff. 6. The patient will be responsible for cost sharing (co-pay) of up to 20% of the service fee (after annual deductible is met). Patient agreed to services and consent obtained.  Patient Care Team: Tyler Koch, MD as PCP - General (Internal Medicine) Tomasa Blase, [facility] as Pharmacist (Pharmacist)  Recent office visits: 10/12/2020 - Dr. Sharlet Salina - overdue for colonoscopy - GI referral placed - atorvastatin increased to 34m daily   [facility]  Objective:  Lab Results  Component Value Date   CREATININE 1.66 (H) 10/12/2020   BUN 22 10/12/2020   GFR 41.68 (L) 10/12/2020   GFRNONAA 51 (L) 05/17/2016   GFRAA 59 (L)  05/17/2016   NA 140 10/12/2020   K 3.9 10/12/2020   CALCIUM 9.9 10/12/2020   CO2 32 10/12/2020   GLUCOSE 107 (H) 10/12/2020    Lab Results  Component Value Date/Time   HGBA1C 6.7 (H) 10/12/2020 08:58 AM   HGBA1C 6.5 03/10/2020 08:35 AM   GFR 41.68 (L) 10/12/2020 08:58 AM   GFR 44.08 (L) 03/10/2020 08:35 AM    Last diabetic Eye exam:  No results found for: HMDIABEYEEXA  Last diabetic Foot exam:  No results found for: HMDIABFOOTEX   Lab Results  Component Value Date   CHOL 218 (H) 10/12/2020   HDL 39.30 10/12/2020   LDLCALC 141 (H) 10/12/2020   LDLDIRECT 166.0 03/10/2020   TRIG 191.0 (H) 10/12/2020   CHOLHDL 6 10/12/2020    Hepatic Function Latest Ref Rng & Units 10/12/2020 03/10/2020 08/07/2018  Total Protein 6.0 - 8.3 g/dL 7.6 7.8 7.7  Albumin 3.5 - 5.2 g/dL 3.9 3.8 4.1  AST 0 - 37 U/L '21 20 21  ' ALT 0 - 53 U/L '28 26 25  ' Alk Phosphatase 39 - 117 U/L 58 65 73  Total Bilirubin 0.2 - 1.2 mg/dL 0.5 0.5 0.6    No results found for: TSH, FREET4  CBC Latest Ref Rng & Units 03/10/2020 11/29/2017 11/01/2016  WBC 4.0 - 10.5 K/uL 7.7 7.0 7.0  Hemoglobin 13.0 - 17.0 g/dL 15.0 14.2 14.8  Hematocrit 39.0 - 52.0 % 45.5 42.9 45.8  Platelets 150.0 - 400.0 K/uL 244.0 259.0 270.0    No results found for: VD25OH  Clinical ASCVD: No  The 10-year ASCVD risk score (Arnett DK, et al., 2019) is: 56.8%   Values used to calculate the score:     Age: 84 years     Sex: Male     Is Non-Hispanic African American: Yes     Diabetic: Yes     Tobacco smoker: Yes     Systolic Blood Pressure: 466 mmHg     Is BP treated: Yes     HDL Cholesterol: 39.3 mg/dL     Total Cholesterol: 218 mg/dL    Depression screen [facility] 2/9 03/10/2020 11/29/2017 11/01/2016  Decreased Interest 0 0 0  Down, Depressed, Hopeless 0 0 0  PHQ - 2 Score 0 0 0       Social History   Tobacco Use  Smoking Status Some Days   Packs/day: 0.50   Types: Cigarettes  Smokeless Tobacco Never   BP Readings from Last 3  Encounters:  10/12/20 134/74  03/10/20 122/80  08/07/18 130/82   Pulse Readings from Last 3 Encounters:  10/12/20 70  03/10/20 79  08/07/18 68   Wt Readings from Last 3 Encounters:  10/12/20 213 lb 6.4 oz (96.8 kg)  03/10/20 211 lb 3.2 oz (95.8 kg)  08/07/18 207 lb (93.9 kg)   BMI Readings from Last 3 Encounters:  10/12/20 33.93 kg/m  03/10/20 33.58 kg/m  08/07/18 32.91 kg/m    Assessment/Interventions: Review of patient past medical history, allergies, medications, health status, including review of consultants reports, laboratory and other test data, was performed as part of comprehensive evaluation and provision of chronic care management services.   SDOH:  (Social Determinants of Health) assessments and interventions performed: Yes  SDOH Screenings   Alcohol Screen: Not on file  Depression (PHQ2-9): Low Risk    PHQ-2 Score: 0  Financial Resource Strain: Low Risk    Difficulty of Paying Living Expenses: Not hard at all  Food Insecurity: Not on file  Housing: Not on file  Physical Activity: Not on file  Social Connections: Not on file  Stress: Not on file  Tobacco Use: High Risk   Smoking Tobacco Use: Some Days   Smokeless Tobacco Use: Never   Passive Exposure: Not on file  Transportation Needs: Not on file    [place]  No Known Allergies  Medications Reviewed Today     Reviewed by Tyler Koch, MD (Physician) on 10/12/20 at [phone]  Med List Status: <None>   Medication Order Taking? Sig Documenting Provider Last Dose Status Informant  amLODipine (NORVASC) 10 MG tablet [id number] Yes Take 1 tablet (10 mg total) by mouth daily. Tyler Koch, MD Taking Active   atorvastatin (LIPITOR) 40 MG tablet [id number] Yes Take 1 tablet (40 mg total) by mouth daily. Tyler Koch, MD Taking Active   lisinopril-hydrochlorothiazide (ZESTORETIC) 20-12.5 MG tablet [id number] Yes Take 1 tablet by mouth daily. Tyler Koch, MD Taking Active    Multiple Vitamin (MULTIVITAMIN) tablet [id number] Yes Take 1 tablet by mouth once a week. To twice weekly [provider] Taking Active   Naproxen Sodium (ALEVE PO) [id number] Yes Take by mouth. As needed [provider] Taking Active             Patient Active Problem List   Diagnosis Date Noted   Polyp of colon 10/12/2020   Type 2 diabetes mellitus with hyperlipidemia ([place]) 03/21/2017   Routine general medical examination at a health care facility 11/01/2016   Proteinuria 02/13/2015   Snoring 02/18/2013   Hyperlipidemia associated  with type 2 diabetes mellitus ([place]) 02/04/2006   HYPERCALCEMIA 02/04/2006   ANEMIA-NOS 02/04/2006   ERECTILE DYSFUNCTION 02/04/2006   TOBACCO ABUSE 02/04/2006   Essential hypertension 02/04/2006   PULMONARY DISEASE 02/04/2006   RENAL INSUFFICIENCY, CHRONIC 02/04/2006    Immunization History  Administered Date(s) Administered   PFIZER(Purple Top)SARS-COV-2 Vaccination 03/20/2019, 04/10/2019   Pneumococcal Conjugate-13 11/01/2016   Pneumococcal Polysaccharide-23 03/10/2020   Tdap 04/18/2007    Conditions to be addressed/monitored:  Hypertension, Hyperlipidemia and Diabetes  There are no care plans that you recently modified to display for this patient.   Medication Assistance: None required.  Patient affirms current coverage meets needs.  Patient's preferred pharmacy is:  [facility], [place] [address] Phone: [phone] Fax: [phone]   Uses pill box? No Pt endorses 85-90% compliance  Care Plan and Follow Up Patient Decision:  Patient agrees to Care Plan and Follow-up.  Plan: Telephone follow up appointment with care management team member scheduled for:  6 months  and The patient has been provided with contact information for the care management team and has been advised to call with any health related questions or concerns.    Patient will be adherent to prescribed medications, will plan to recheck lipid panel with next PCP appointment to assess LDL control with consistent atorvastatin use.  Patient will also set a quit date for tobacco use before next appointment   Tomasa Blase, PharmD Clinical Pharmacist, [place]   ***  Chart prep = 6 minutes

## 2020-12-28 ENCOUNTER — Other Ambulatory Visit: Payer: Self-pay | Admitting: Internal Medicine

## 2020-12-28 DIAGNOSIS — I10 Essential (primary) hypertension: Secondary | ICD-10-CM

## 2021-01-10 ENCOUNTER — Encounter: Payer: Medicare (Managed Care) | Attending: Internal Medicine | Admitting: Dietician

## 2021-01-10 ENCOUNTER — Other Ambulatory Visit: Payer: Self-pay

## 2021-01-10 ENCOUNTER — Encounter: Payer: Self-pay | Admitting: Dietician

## 2021-01-10 VITALS — Ht 66.0 in | Wt 211.9 lb

## 2021-01-10 DIAGNOSIS — E119 Type 2 diabetes mellitus without complications: Secondary | ICD-10-CM | POA: Insufficient documentation

## 2021-01-10 NOTE — Patient Instructions (Addendum)
Work towards eating three meals a day, about 5-6 hours apart!  Begin to recognize carbohydrates, proteins, and non-starchy vegetables in your food choices!  Begin to build your meals using the proportions of the Balanced Plate. First, select your carb choice(s) for the meal. Make this 25% of your meal. Next, select your source of protein to pair with your carb choice(s). Make this another 25% of your meal. Finally, complete your meal with a variety of non-starchy vegetables. Make this the remaining 50% of your meal.  When choosing your proteins, choose low fat/lean meats.   Keep up the great work exercising!

## 2021-01-10 NOTE — Progress Notes (Signed)
Diabetes Self-Management Education  Visit Type: First/Initial  Appt. Start Time: 0800 Appt. End Time: 2426  01/10/2021  Mr. Tyler Klein, identified by name and date of birth, is a 71 y.o. male with a diagnosis of Diabetes: Type 2.   ASSESSMENT Pt has hyperlipidemia and hypertension, is currently taking lipitor, lisinopril, and amlodipine. No diabetes medications. Pt reports exercising at the boxing gym, hitting the heavy bag and skipping rope, currently once a week and plans on going multiple times a week. Pt has a history of being physically active.  Pt reports fried chicken is there main problem food. Pt states they don't want to have to get rid of all of their favorite foods. 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 Pt has a fridge and microwave at work. Pt usually snacks during the day while at work, will eat a meal when they get home. Height _0  (1.676 m), weight 211 lb 14.4 oz (96.1 kg). Body mass index is 34.2 kg/m.   Diabetes Self-Management Education - 01/10/21 0820       Visit Information   Visit Type First/Initial      Initial Visit   Diabetes Type Type 2    Are you currently following a meal plan? No    Are you taking your medications as prescribed? Not on Medications    Date Diagnosed 2019      Health Coping   How would you rate your overall health? Good      Psychosocial Assessment   Patient Belief/Attitude about Diabetes Motivated to manage diabetes    Self-care barriers None    Self-management support Doctor's office    Other persons present Patient    Patient Concerns Nutrition/Meal planning    Special Needs None    Preferred Learning Style No preference indicated    Learning Readiness Contemplating    How often do you need to have someone help you when you read instructions, pamphlets, or other written materials from your doctor or pharmacy? 1 - Never    What is the last grade level you completed in school? 12th grade       Pre-Education Assessment   Patient understands the diabetes disease and treatment process. Needs Instruction    Patient understands incorporating nutritional management into lifestyle. Needs Instruction    Patient undertands incorporating physical activity into lifestyle. Needs Instruction    Patient understands using medications safely. Needs Instruction    Patient understands monitoring blood glucose, interpreting and using results Needs Instruction    Patient understands prevention, detection, and treatment of acute complications. Needs Instruction    Patient understands prevention, detection, and treatment of chronic complications. Needs Instruction    Patient understands how to develop strategies to address psychosocial issues. Needs Instruction    Patient understands how to develop strategies to promote health/change behavior. Needs Instruction      Complications   Last HgB A1C per patient/outside source 6.7 %   10/12/2020   How often do you check your blood sugar? Not recommended by provider    Have you had a dilated eye exam in the past 12 months? Yes    Have you had a dental exam in the past 12 months? No    Are you checking your feet? Yes    How many days per week are you checking your feet? 3      Dietary Intake   Breakfast cup of coffee, sugar, cream    Snack (morning)  bag of chips    Snack (afternoon) bag of chips    Dinner Fried chicken, mac and cheese, mashed potatoes.    Beverage(s) coffee, ginger ale,      Exercise   Exercise Type ADL's;Moderate (swimming / aerobic walking)    How many days per week to you exercise? 3    How many minutes per day do you exercise? 20    Total minutes per week of exercise 60      Patient Education   Previous Diabetes Education No    Disease state  Factors that contribute to the development of diabetes;Explored patient's options for treatment of their diabetes    Nutrition management  Role of diet in the treatment of diabetes and the  relationship between the three main macronutrients and blood glucose level;Food label reading, portion sizes and measuring food.;Information on hints to eating out and maintain blood glucose control.    Physical activity and exercise  Role of exercise on diabetes management, blood pressure control and cardiac health.;Identified with patient nutritional and/or medication changes necessary with exercise.;Helped patient identify appropriate exercises in relation to his/her diabetes, diabetes complications and other health issue.    Chronic complications Relationship between chronic complications and blood glucose control;Assessed and discussed foot care and prevention of foot problems;Lipid levels, blood glucose control and heart disease;Nephropathy, what it is, prevention of, the use of ACE, ARB's and early detection of through urine microalbumia.    Psychosocial adjustment Role of stress on diabetes;Identified and addressed patients feelings and concerns about diabetes    Personal strategies to promote health Lifestyle issues that need to be addressed for better diabetes care      Individualized Goals (developed by patient)   Nutrition Follow meal plan discussed;General guidelines for healthy choices and portions discussed    Physical Activity Exercise 1-2 times per week    Medications Not Applicable    Monitoring  Not Applicable      Post-Education Assessment   Patient understands the diabetes disease and treatment process. Needs Review    Patient understands incorporating nutritional management into lifestyle. Needs Review    Patient undertands incorporating physical activity into lifestyle. Needs Review    Patient understands using medications safely. Needs Review    Patient understands monitoring blood glucose, interpreting and using results Needs Review    Patient understands prevention, detection, and treatment of acute complications. Needs Review    Patient understands prevention, detection,  and treatment of chronic complications. Needs Review    Patient understands how to develop strategies to address psychosocial issues. Needs Review    Patient understands how to develop strategies to promote health/change behavior. Needs Review      Outcomes   Expected Outcomes Demonstrated limited interest in learning.  Expect minimal changes    Future DMSE PRN    Program Status Other (comment)   Follow up as needed            Individualized Plan for Diabetes Self-Management Training:   Learning Objective:  Patient will have a greater understanding of diabetes self-management. Patient education plan is to attend individual and/or group sessions per assessed needs and concerns.   Plan:   Patient Instructions  Work towards eating three meals a day, about 5-6 hours apart!  Begin to recognize carbohydrates, proteins, and non-starchy vegetables in your food choices!  Begin to build your meals using the proportions of the Balanced Plate. First, select your carb choice(s) for the meal. Make this 25% of your meal. Next,  select your source of protein to pair with your carb choice(s). Make this another 25% of your meal. Finally, complete your meal with a variety of non-starchy vegetables. Make this the remaining 50% of your meal.  When choosing your proteins, choose low fat/lean meats.   Keep up the great work exercising!   Expected Outcomes:  Demonstrated limited interest in learning.  Expect minimal changes  Education material provided: My Plate and Snack sheet  If problems or questions, patient to contact team via:  Phone and Email  Future DSME appointment: PRN

## 2021-01-19 ENCOUNTER — Ambulatory Visit: Payer: Medicare HMO | Admitting: Internal Medicine

## 2021-01-27 ENCOUNTER — Encounter: Payer: Self-pay | Admitting: Internal Medicine

## 2021-01-27 ENCOUNTER — Other Ambulatory Visit: Payer: Self-pay

## 2021-01-27 ENCOUNTER — Ambulatory Visit (INDEPENDENT_AMBULATORY_CARE_PROVIDER_SITE_OTHER): Payer: Medicare (Managed Care) | Admitting: Internal Medicine

## 2021-01-27 VITALS — BP 122/82 | HR 74 | Resp 18 | Ht 66.0 in | Wt 214.2 lb

## 2021-01-27 DIAGNOSIS — E1169 Type 2 diabetes mellitus with other specified complication: Secondary | ICD-10-CM

## 2021-01-27 DIAGNOSIS — E785 Hyperlipidemia, unspecified: Secondary | ICD-10-CM

## 2021-01-27 LAB — COMPREHENSIVE METABOLIC PANEL
ALT: 27 U/L (ref 0–53)
AST: 20 U/L (ref 0–37)
Albumin: 4 g/dL (ref 3.5–5.2)
Alkaline Phosphatase: 65 U/L (ref 39–117)
BUN: 28 mg/dL — ABNORMAL HIGH (ref 6–23)
CO2: 31 mEq/L (ref 19–32)
Calcium: 10.2 mg/dL (ref 8.4–10.5)
Chloride: 101 mEq/L (ref 96–112)
Creatinine, Ser: 1.61 mg/dL — ABNORMAL HIGH (ref 0.40–1.50)
GFR: 43.15 mL/min — ABNORMAL LOW (ref >60.00)
Glucose, Bld: 117 mg/dL — ABNORMAL HIGH (ref 70–99)
Potassium: 3.5 mEq/L (ref 3.5–5.1)
Sodium: 138 mEq/L (ref 135–145)
Total Bilirubin: 0.5 mg/dL (ref 0.2–1.2)
Total Protein: 7.7 g/dL (ref 6.0–8.3)

## 2021-01-27 LAB — LIPID PANEL
Cholesterol: 196 mg/dL (ref 0–200)
HDL: 34.2 mg/dL — ABNORMAL LOW (ref >39.00)
LDL Cholesterol: 127 mg/dL — ABNORMAL HIGH (ref 0–99)
NonHDL: 161.59
Total CHOL/HDL Ratio: 6
Triglycerides: 174 mg/dL — ABNORMAL HIGH (ref 0.0–149.0)
VLDL: 34.8 mg/dL (ref 0.0–40.0)

## 2021-01-27 LAB — HEMOGLOBIN A1C: Hgb A1c MFr Bld: 6.7 % — ABNORMAL HIGH (ref 4.6–6.5)

## 2021-01-27 NOTE — Patient Instructions (Signed)
We will check the labs today.  Work on some small changes.

## 2021-01-27 NOTE — Assessment & Plan Note (Addendum)
Has met with nutritionist and is making some small changes. Reinforced that today. Reminded about yearly eye exam. Taking ACE-I. Checking HgA1c. Adjust as needed, diet controlled currently.

## 2021-01-27 NOTE — Assessment & Plan Note (Signed)
Checking lipid panel and adjust as needed. Is taking atorvastatin 80 mg daily.

## 2021-01-27 NOTE — Progress Notes (Signed)
° °  Subjective:   Patient ID: Tyler Klein, male    DOB: 24-Jan-1950, 71 y.o.   MRN: [id number]  HPI The patient is a 71 YO man coming in for follow up. Increased dose of atorvastatin last visit.   Review of Systems  Constitutional: Negative.   HENT: Negative.    Eyes: Negative.   Respiratory:  Negative for cough, chest tightness and shortness of breath.   Cardiovascular:  Negative for chest pain, palpitations and leg swelling.  Gastrointestinal:  Negative for abdominal distention, abdominal pain, constipation, diarrhea, nausea and vomiting.  Musculoskeletal: Negative.   Skin: Negative.   Neurological: Negative.   Psychiatric/Behavioral: Negative.     Objective:  Physical Exam Constitutional:      Appearance: He is well-developed.  HENT:     Head: Normocephalic and atraumatic.  Cardiovascular:     Rate and Rhythm: Normal rate and regular rhythm.  Pulmonary:     Effort: Pulmonary effort is normal. No respiratory distress.     Breath sounds: Normal breath sounds. No wheezing or rales.  Abdominal:     General: Bowel sounds are normal. There is no distension.     Palpations: Abdomen is soft.     Tenderness: There is no abdominal tenderness. There is no rebound.  Musculoskeletal:     Cervical back: Normal range of motion.  Skin:    General: Skin is warm and dry.  Neurological:     Mental Status: He is alert and oriented to person, place, and time.     Coordination: Coordination normal.    Vitals:   01/27/21 0809  BP: 122/82  Pulse: 74  Resp: 18  SpO2: 93%  Weight: 214 lb 3.2 oz (97.2 kg)  Height: 5\' 6"  (1.676 m)    This visit occurred during the SARS-CoV-2 public health emergency.  Safety protocols were in place, including screening questions prior to the visit, additional usage of staff PPE, and extensive cleaning of exam room while observing appropriate contact time as indicated for disinfecting solutions.   Assessment & Plan:

## 2021-02-27 ENCOUNTER — Telehealth: Payer: Self-pay | Admitting: Internal Medicine

## 2021-02-27 NOTE — Telephone Encounter (Signed)
LVM for pt to rtn my call to schedule AWV with [facility]. Please schedule appt if pt calls the office.  

## 2021-03-14 ENCOUNTER — Ambulatory Visit (INDEPENDENT_AMBULATORY_CARE_PROVIDER_SITE_OTHER): Payer: Medicare (Managed Care)

## 2021-03-14 ENCOUNTER — Other Ambulatory Visit: Payer: Self-pay

## 2021-03-14 VITALS — BP 118/60 | HR 76 | Temp 97.9°F | Ht 66.0 in | Wt 207.4 lb

## 2021-03-14 DIAGNOSIS — H269 Unspecified cataract: Secondary | ICD-10-CM

## 2021-03-14 DIAGNOSIS — Z1211 Encounter for screening for malignant neoplasm of colon: Secondary | ICD-10-CM | POA: Diagnosis not present

## 2021-03-14 DIAGNOSIS — Z0000 Encounter for general adult medical examination without abnormal findings: Secondary | ICD-10-CM

## 2021-03-14 NOTE — Progress Notes (Signed)
Subjective:   Tyler Klein is a 71 y.o. male who presents for Medicare Annual/Subsequent preventive examination.  Review of Systems     Cardiac Risk Factors include: advanced age (>73mn, >>38women);dyslipidemia;family history of premature cardiovascular disease;hypertension;male gender;obesity (BMI >30kg/m2)     Objective:    Today's Vitals   03/14/21 0841  BP: 118/60  Pulse: 76  Temp: 97.9 F (36.6 C)  SpO2: 95%  Weight: 207 lb 6.4 oz (94.1 kg)  Height: '5\' 6"'$  (1.676 m)  PainSc: 0-No pain   Body mass index is 33.48 kg/m.  Advanced Directives 03/14/2021 01/10/2021 12/01/2014  Does Patient Have a Medical Advance Directive? No No No  Would patient like information on creating a medical advance directive? No - Patient declined No - Patient declined -    Current Medications (verified) Outpatient Encounter Medications as of 03/14/2021  Medication Sig   amLODipine (NORVASC) 10 MG tablet TAKE 1 TABLET EVERY DAY   atorvastatin (LIPITOR) 80 MG tablet Take 1 tablet (80 mg total) by mouth daily.   lisinopril-hydrochlorothiazide (ZESTORETIC) 20-12.5 MG tablet TAKE 1 TABLET EVERY DAY   Multiple Vitamin (MULTIVITAMIN) tablet Take 1 tablet by mouth once a week. To twice weekly   Naproxen Sodium (ALEVE PO) Take by mouth. As needed   No facility-administered encounter medications on file as of 03/14/2021.    Allergies (verified) Patient has no known allergies.   History: Past Medical History:  Diagnosis Date   Allergy    seasonal   HSV-1 (herpes simplex virus 1) infection    Hyperlipidemia    Hypertension    Past Surgical History:  Procedure Laterality Date   APPENDECTOMY     age [age]   LUNG SURGERY  2011   left lung thoracotomy (01/2006)   Family History  Problem Relation Age of Onset   Hypertension Son    Hypertension Sister    Hypertension Mother    Colon cancer Neg Hx    Colon polyps Neg Hx    Esophageal cancer Neg Hx    Prostate cancer Neg Hx    Rectal cancer  Neg Hx    Stomach cancer Neg Hx    Social History   Socioeconomic History   Marital status: Married    Spouse name: Not on file   Number of children: Not on file   Years of education: Not on file   Highest education level: Not on file  Occupational History   Not on file  Tobacco Use   Smoking status: Some Days    Packs/day: 0.50    Types: Cigarettes   Smokeless tobacco: Never  Substance and Sexual Activity   Alcohol use: No    Alcohol/week: 0.0 standard drinks   Drug use: No   Sexual activity: Not on file  Other Topics Concern   Not on file  Social History Narrative   Not on file   Social Determinants of Health   Financial Resource Strain: Low Risk    Difficulty of Paying Living Expenses: Not hard at all  Food Insecurity: No Food Insecurity   Worried About RCharity fundraiserin the Last Year: Never true   Ran Out of Food in the Last Year: Never true  Transportation Needs: No Transportation Needs   Lack of Transportation (Medical): No   Lack of Transportation (Non-Medical): No  Physical Activity: Sufficiently Active   Days of Exercise per Week: 5 days   Minutes of Exercise per Session: 30 min  Stress: No Stress  Concern Present   Feeling of Stress : Not at all  Social Connections: Socially Integrated   Frequency of Communication with Friends and Family: More than three times a week   Frequency of Social Gatherings with Friends and Family: More than three times a week   Attends Religious Services: 1 to 4 times per year   Active Member of [employer] or Organizations: No   Attends Music therapist: 1 to 4 times per year   Marital Status: Married    Tobacco Counseling Ready to quit: Not Answered Counseling given: Not Answered   Clinical Intake:  Pre-visit preparation completed: Yes  Pain : No/denies pain Pain Score: 0-No pain     BMI - recorded: 33.48 Nutritional Status: BMI > 30  Obese Nutritional Risks: None Diabetes: No  How often do you  need to have someone help you when you read instructions, pamphlets, or other written materials from your doctor or pharmacy?: 1 - Never What is the last grade level you completed in school?: HSG; 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 no  Interpreter Needed?: No  Information entered by :: Lisette Abu, LPN   Activities of Daily Living In your present state of health, do you have any difficulty performing the following activities: 03/14/2021 10/12/2020  Hearing? N N  Vision? Y N  Difficulty concentrating or making decisions? N N  Walking or climbing stairs? N N  Dressing or bathing? N N  Doing errands, shopping? N N  Preparing Food and eating ? N -  Using the Toilet? N -  In the past six months, have you accidently leaked urine? N -  Do you have problems with loss of bowel control? N -  Managing your Medications? N -  Managing your Finances? N -  Housekeeping or managing your Housekeeping? N -  Some recent data might be hidden    Patient Care Team: Hoyt Koch, MD as PCP - General (Internal Medicine) Delice Bison, Darnelle Maffucci, [facility] as Pharmacist (Pharmacist)  Indicate any recent Medical Services you may have received from other than [facility] providers in the past year (date may be approximate).     Assessment:   This is a routine wellness examination for Quintus.  Hearing/Vision screen Hearing Screening - Comments:: Patient denied any hearing difficulty.   No hearing aids.  Vision Screening - Comments:: Patient does wear corrective lenses/contacts.    Dietary issues and exercise activities discussed: Current Exercise Habits: Home exercise routine, Type of exercise: walking;Other - see comments (cardio workouts), Time (Minutes): 30, Frequency (Times/Week): 5, Weekly Exercise (Minutes/Week): 150, Intensity: Moderate, Exercise limited by: None identified   Goals Addressed               This Visit's Progress     Patient Stated (pt-stated)        My goal  is to stay healthy, independent and socially engaged.       Depression Screen PHQ 2/9 Scores 03/14/2021 01/10/2021 03/10/2020 11/29/2017 11/01/2016 05/17/2016 11/26/2015  PHQ - 2 Score 0 0 0 0 0 0 0    Fall Risk Fall Risk  03/14/2021 01/10/2021 03/10/2020 07/04/2017 05/17/2016  Falls in the past year? 0 0 0 No No  Number falls in past yr: 0 0 0 - -  Injury with Fall? 0 - 0 - -  Risk for fall due to : No Fall Risks - - - -  Follow up Falls evaluation completed - - - -  FALL RISK PREVENTION PERTAINING TO THE HOME:  Any stairs in or around the home? No  If so, are there any without handrails? No  Home free of loose throw rugs in walkways, pet beds, electrical cords, etc? Yes  Adequate lighting in your home to reduce risk of falls? Yes   ASSISTIVE DEVICES UTILIZED TO PREVENT FALLS:  Life alert? No  Use of a cane, walker or w/c? No  Grab bars in the bathroom? No  Shower chair or bench in shower? No  Elevated toilet seat or a handicapped toilet? No   TIMED UP AND GO:  Was the test performed? Yes .  Length of time to ambulate 10 feet: 6 sec.   Gait steady and fast without use of assistive device  Cognitive Function: Normal cognitive status assessed by direct observation by this Nurse Health Advisor. No abnormalities found.          Immunizations Immunization History  Administered Date(s) Administered   PFIZER(Purple Top)SARS-COV-2 Vaccination 03/20/2019, 04/10/2019   Pneumococcal Conjugate-13 11/01/2016   Pneumococcal Polysaccharide-23 03/10/2020   Tdap 04/18/2007    TDAP status: Due, Education has been provided regarding the importance of this vaccine. Advised may receive this vaccine at local pharmacy or Health Dept. Aware to provide a copy of the vaccination record if obtained from local pharmacy or Health Dept. Verbalized acceptance and understanding.  Flu Vaccine status: Declined, Education has been provided regarding the importance of this vaccine but patient still declined.  Advised may receive this vaccine at local pharmacy or Health Dept. Aware to provide a copy of the vaccination record if obtained from local pharmacy or Health Dept. Verbalized acceptance and understanding.  Pneumococcal vaccine status: Up to date  Covid-19 vaccine status: Completed vaccines  Qualifies for Shingles Vaccine? Yes   Zostavax completed No   Shingrix Completed?: No.    Education has been provided regarding the importance of this vaccine. Patient has been advised to call insurance company to determine out of pocket expense if they have not yet received this vaccine. Advised may also receive vaccine at local pharmacy or Health Dept. Verbalized acceptance and understanding.  Screening Tests Health Maintenance  Topic Date Due   OPHTHALMOLOGY EXAM  Never done   Zoster Vaccines- Shingrix (1 of 2) Never done   TETANUS/TDAP  04/17/2017   COVID-19 Vaccine (3 - Booster for Pfizer series) 06/05/2019   COLONOSCOPY (Pts 45-62yr Insurance coverage will need to be confirmed)  12/15/2019   INFLUENZA VACCINE  Never done   FOOT EXAM  03/10/2021   HEMOGLOBIN A1C  07/27/2021   Pneumonia Vaccine 71 Years old  Completed   Hepatitis C Screening  Completed   HPV VACCINES  Aged Out    Health Maintenance  Health Maintenance Due  Topic Date Due   OPHTHALMOLOGY EXAM  Never done   Zoster Vaccines- Shingrix (1 of 2) Never done   TETANUS/TDAP  04/17/2017   COVID-19 Vaccine (3 - Booster for Pfizer series) 06/05/2019   COLONOSCOPY (Pts 45-455yrInsurance coverage will need to be confirmed)  12/15/2019   INFLUENZA VACCINE  Never done   FOOT EXAM  03/10/2021    Colorectal cancer screening: Referral to GI placed 03/14/2021. Pt aware the office will call re: appt.  Lung Cancer Screening: (Low Dose CT Chest recommended if Age 71-80ears, 30 pack-year currently smoking OR have quit w/in 15years.) does not qualify.   Lung Cancer Screening Referral: no  Additional Screening:  Hepatitis C Screening:  does qualify; Completed yes  Vision Screening: Recommended annual ophthalmology exams for early detection of glaucoma and other disorders of the eye. Is the patient up to date with their annual eye exam?  No  Who is the provider or what is the name of the office in which the patient attends annual eye exams? None If pt is not established with a provider, would they like to be referred to a provider to establish care? Yes .   Dental Screening: Recommended annual dental exams for proper oral hygiene  Community Resource Referral / Chronic Care Management: CRR required this visit?  Yes   CCM required this visit?  No      Plan:     I have personally reviewed and noted the following in the patients chart:   Medical and social history Use of alcohol, tobacco or illicit drugs  Current medications and supplements including opioid prescriptions. Patient is not currently taking opioid prescriptions. Functional ability and status Nutritional status Physical activity Advanced directives List of other physicians Hospitalizations, surgeries, and ER visits in previous 12 months Vitals Screenings to include cognitive, depression, and falls Referrals and appointments  In addition, I have reviewed and discussed with patient certain preventive protocols, quality metrics, and best practice recommendations. A written personalized care plan for preventive services as well as general preventive health recommendations were provided to patient.     Sheral Flow, LPN   03/15/486   Nurse Notes:  Referral to Gastroenterolgy for screening colonoscopy. Referral to Ophthalmology for screening; possible bilateral cataracts.

## 2021-03-14 NOTE — Patient Instructions (Signed)
Tyler Klein , Thank you for taking time to come for your Medicare Wellness Visit. I appreciate your ongoing commitment to your health goals. Please review the following plan we discussed and let me know if I can assist you in the future.   Screening recommendations/referrals: Colonoscopy: 12/15/2014; due every 10 years (due 12/14/2024) Recommended yearly ophthalmology/optometry visit for glaucoma screening and checkup Recommended yearly dental visit for hygiene and checkup  Vaccinations: Influenza vaccine: declined Pneumococcal vaccine: 11/01/2016, 03/10/2020 Tdap vaccine: 04/18/2007; due every 10 years (overdue) Shingles vaccine: never done   Covid-19: 03/20/2019, 04/10/2019  Advanced directives: Yes; family aware of wishes.  Conditions/risks identified: Yes; Client understands the importance of follow-up appointments with providers by attending scheduled visits and discussed goals to eat healthier, increase physical activity 5 times a week for 30 minutes each, exercise the brain by doing stimulating brain exercises (reading, adult coloring, crafting, listening to music, puzzles, etc.), socialize and enjoy life more, get enough sleep at least 8-9 hours average per night and make time for laughter.  Next appointment: Please schedule your next Medicare Wellness Visit with your Nurse Health Advisor in 1 year by calling [phone].  Preventive Care 71 Years and Older, Male Preventive care refers to lifestyle choices and visits with your health care provider that can promote health and wellness. What does preventive care include? A yearly physical exam. This is also called an annual well check. Dental exams once or twice a year. Routine eye exams. Ask your health care provider how often you should have your eyes checked. Personal lifestyle choices, including: Daily care of your teeth and gums. Regular physical activity. Eating a healthy diet. Avoiding tobacco and drug use. Limiting alcohol  use. Practicing safe sex. Taking low doses of aspirin every day. Taking vitamin and mineral supplements as recommended by your health care provider. What happens during an annual well check? The services and screenings done by your health care provider during your annual well check will depend on your age, overall health, lifestyle risk factors, and family history of disease. Counseling  Your health care provider may ask you questions about your: Alcohol use. Tobacco use. Drug use. Emotional well-being. Home and relationship well-being. Sexual activity. Eating habits. History of falls. Memory and ability to understand (cognition). Work and work Statistician. Screening  You may have the following tests or measurements: Height, weight, and BMI. Blood pressure. Lipid and cholesterol levels. These may be checked every 5 years, or more frequently if you are over 71 years old. Skin check. Lung cancer screening. You may have this screening every year starting at age 71 if you have a 30-pack-year history of smoking and currently smoke or have quit within the past 15 years. Fecal occult blood test (FOBT) of the stool. You may have this test every year starting at age 71. Flexible sigmoidoscopy or colonoscopy. You may have a sigmoidoscopy every 5 years or a colonoscopy every 10 years starting at age 71. Prostate cancer screening. Recommendations will vary depending on your family history and other risks. Hepatitis C blood test. Hepatitis B blood test. Sexually transmitted disease (STD) testing. Diabetes screening. This is done by checking your blood sugar (glucose) after you have not eaten for a while (fasting). You may have this done every 1-3 years. Abdominal aortic aneurysm (AAA) screening. You may need this if you are a current or former smoker. Osteoporosis. You may be screened starting at age 71 if you are at high risk. Talk with your health care provider about your  test results,  treatment options, and if necessary, the need for more tests. Vaccines  Your health care provider may recommend certain vaccines, such as: Influenza vaccine. This is recommended every year. Tetanus, diphtheria, and acellular pertussis (Tdap, Td) vaccine. You may need a Td booster every 10 years. Zoster vaccine. You may need this after age 60. Pneumococcal 13-valent conjugate (PCV13) vaccine. One dose is recommended after age 65. Pneumococcal polysaccharide (PPSV23) vaccine. One dose is recommended after age 73. Talk to your health care provider about which screenings and vaccines you need and how often you need them. This information is not intended to replace advice given to you by your health care provider. Make sure you discuss any questions you have with your health care provider. Document Released: 01/21/2015 Document Revised: 09/14/2015 Document Reviewed: 10/26/2014 Elsevier Interactive Patient Education  2017 [place] Prevention in the Home Falls can cause injuries. They can happen to people of all ages. There are many things you can do to make your home safe and to help prevent falls. What can I do on the outside of my home? Regularly fix the edges of walkways and driveways and fix any cracks. Remove anything that might make you trip as you walk through a door, such as a raised step or threshold. Trim any bushes or trees on the path to your home. Use bright outdoor lighting. Clear any walking paths of anything that might make someone trip, such as rocks or tools. Regularly check to see if handrails are loose or broken. Make sure that both sides of any steps have handrails. Any raised decks and porches should have guardrails on the edges. Have any leaves, snow, or ice cleared regularly. Use sand or salt on walking paths during winter. Clean up any spills in your garage right away. This includes oil or grease spills. What can I do in the bathroom? Use night  lights. Install grab bars by the toilet and in the tub and shower. Do not use towel bars as grab bars. Use non-skid mats or decals in the tub or shower. If you need to sit down in the shower, use a plastic, non-slip stool. Keep the floor dry. Clean up any water that spills on the floor as soon as it happens. Remove soap buildup in the tub or shower regularly. Attach bath mats securely with double-sided non-slip rug tape. Do not have throw rugs and other things on the floor that can make you trip. What can I do in the bedroom? Use night lights. Make sure that you have a light by your bed that is easy to reach. Do not use any sheets or blankets that are too big for your bed. They should not hang down onto the floor. Have a firm chair that has side arms. You can use this for support while you get dressed. Do not have throw rugs and other things on the floor that can make you trip. What can I do in the kitchen? Clean up any spills right away. Avoid walking on wet floors. Keep items that you use a lot in easy-to-reach places. If you need to reach something above you, use a strong step stool that has a grab bar. Keep electrical cords out of the way. Do not use floor polish or wax that makes floors slippery. If you must use wax, use non-skid floor wax. Do not have throw rugs and other things on the floor that can make you trip. What can I do with my  stairs? Do not leave any items on the stairs. Make sure that there are handrails on both sides of the stairs and use them. Fix handrails that are broken or loose. Make sure that handrails are as long as the stairways. Check any carpeting to make sure that it is firmly attached to the stairs. Fix any carpet that is loose or worn. Avoid having throw rugs at the top or bottom of the stairs. If you do have throw rugs, attach them to the floor with carpet tape. Make sure that you have a light switch at the top of the stairs and the bottom of the stairs. If  you do not have them, ask someone to add them for you. What else can I do to help prevent falls? Wear shoes that: Do not have high heels. Have rubber bottoms. Are comfortable and fit you well. Are closed at the toe. Do not wear sandals. If you use a stepladder: Make sure that it is fully opened. Do not climb a closed stepladder. Make sure that both sides of the stepladder are locked into place. Ask someone to hold it for you, if possible. Clearly mark and make sure that you can see: Any grab bars or handrails. First and last steps. Where the edge of each step is. Use tools that help you move around (mobility aids) if they are needed. These include: Canes. Walkers. Scooters. Crutches. Turn on the lights when you go into a dark area. Replace any light bulbs as soon as they burn out. Set up your furniture so you have a clear path. Avoid moving your furniture around. If any of your floors are uneven, fix them. If there are any pets around you, be aware of where they are. Review your medicines with your doctor. Some medicines can make you feel dizzy. This can increase your chance of falling. Ask your doctor what other things that you can do to help prevent falls. This information is not intended to replace advice given to you by your health care provider. Make sure you discuss any questions you have with your health care provider. Document Released: 10/21/2008 Document Revised: 06/02/2015 Document Reviewed: 01/29/2014 Elsevier Interactive Patient Education  2017 [employer].

## 2021-04-10 ENCOUNTER — Ambulatory Visit (INDEPENDENT_AMBULATORY_CARE_PROVIDER_SITE_OTHER): Payer: Medicare (Managed Care) | Admitting: Internal Medicine

## 2021-04-10 ENCOUNTER — Encounter: Payer: Self-pay | Admitting: Internal Medicine

## 2021-04-10 VITALS — BP 130/78 | HR 69 | Resp 18 | Ht 66.0 in | Wt 208.6 lb

## 2021-04-10 DIAGNOSIS — N400 Enlarged prostate without lower urinary tract symptoms: Secondary | ICD-10-CM | POA: Diagnosis not present

## 2021-04-10 DIAGNOSIS — E1169 Type 2 diabetes mellitus with other specified complication: Secondary | ICD-10-CM

## 2021-04-10 DIAGNOSIS — F17200 Nicotine dependence, unspecified, uncomplicated: Secondary | ICD-10-CM

## 2021-04-10 DIAGNOSIS — Z0000 Encounter for general adult medical examination without abnormal findings: Secondary | ICD-10-CM | POA: Diagnosis not present

## 2021-04-10 DIAGNOSIS — E785 Hyperlipidemia, unspecified: Secondary | ICD-10-CM | POA: Diagnosis not present

## 2021-04-10 DIAGNOSIS — K635 Polyp of colon: Secondary | ICD-10-CM

## 2021-04-10 LAB — MICROALBUMIN / CREATININE URINE RATIO
Creatinine,U: 157 mg/dL
Microalb Creat Ratio: 111.1 mg/g — ABNORMAL HIGH (ref 0.0–30.0)
Microalb, Ur: 174.4 mg/dL — ABNORMAL HIGH (ref 0.0–1.9)

## 2021-04-10 LAB — PSA: PSA: 3.48 ng/mL (ref 0.10–4.00)

## 2021-04-10 NOTE — Progress Notes (Signed)
? ?  Subjective:  ? ?Patient ID: CHETT TANIGUCHI, male    DOB: [birth year], 71 y.o.   MRN: [id number] ? ?HPI ?The patient is here for physical.  ? ?PMH, [facility], social history reviewed and updated ? ?Review of Systems  ?Constitutional: Negative.   ?HENT: Negative.    ?Eyes: Negative.   ?Respiratory:  Negative for cough, chest tightness and shortness of breath.   ?Cardiovascular:  Negative for chest pain, palpitations and leg swelling.  ?Gastrointestinal:  Negative for abdominal distention, abdominal pain, constipation, diarrhea, nausea and vomiting.  ?Musculoskeletal: Negative.   ?Skin: Negative.   ?Neurological: Negative.   ?Psychiatric/Behavioral: Negative.    ? ?Objective:  ?Physical Exam ?Constitutional:   ?   Appearance: He is well-developed.  ?HENT:  ?   Head: Normocephalic and atraumatic.  ?Cardiovascular:  ?   Rate and Rhythm: Normal rate and regular rhythm.  ?Pulmonary:  ?   Effort: Pulmonary effort is normal. No respiratory distress.  ?   Breath sounds: Normal breath sounds. No wheezing or rales.  ?Abdominal:  ?   General: Bowel sounds are normal. There is no distension.  ?   Palpations: Abdomen is soft.  ?   Tenderness: There is no abdominal tenderness. There is no rebound.  ?Musculoskeletal:  ?   Cervical back: Normal range of motion.  ?Skin: ?   General: Skin is warm and dry.  ?   Comments: Foot exam done  ?Neurological:  ?   Mental Status: He is alert and oriented to person, place, and time.  ?   Coordination: Coordination normal.  ? ? ?Vitals:  ? 04/10/21 0847  ?BP: 130/78  ?Pulse: 69  ?Resp: 18  ?SpO2: 96%  ?Weight: 208 lb 9.6 oz (94.6 kg)  ?Height: '5\' 6"'$  (1.676 m)  ? ? ?This visit occurred during the SARS-CoV-2 public health emergency.  Safety protocols were in place, including screening questions prior to the visit, additional usage of staff PPE, and extensive cleaning of exam room while observing appropriate contact time as indicated for disinfecting solutions.  ? ?Assessment & Plan:  ? ?

## 2021-04-10 NOTE — Patient Instructions (Signed)
For the colonoscopy [phone] option 1 ? ?We will check the labs today. ?

## 2021-04-11 NOTE — Assessment & Plan Note (Signed)
Taking atorvastatin 80 mg daily and will continue. ?

## 2021-04-11 NOTE — Assessment & Plan Note (Signed)
Due for colonoscopy and referral had been placed. Given number to call and schedule.  ?

## 2021-04-11 NOTE — Assessment & Plan Note (Signed)
Foot exam done and microalbumin to creatinine ratio checked today. Not due for HgA1c today. Is on ACE-I and statin.  ?

## 2021-04-11 NOTE — Assessment & Plan Note (Signed)
Rare smoking and advised to quit if able. He is unable to make quit attempt currently.  ?

## 2021-04-11 NOTE — Assessment & Plan Note (Signed)
Flu shot declines. Covid-19 declines. Pneumonia complete. Shingrix counseled. Tetanus due declines. Colonoscopy due counseled. Counseled about sun safety and mole surveillance. Counseled about the dangers of distracted driving. Given 10 year screening recommendations.  ? ?

## 2021-05-16 ENCOUNTER — Telehealth: Payer: Self-pay | Admitting: Internal Medicine

## 2021-05-16 DIAGNOSIS — I10 Essential (primary) hypertension: Secondary | ICD-10-CM

## 2021-05-16 DIAGNOSIS — E1169 Type 2 diabetes mellitus with other specified complication: Secondary | ICD-10-CM

## 2021-05-16 MED ORDER — AMLODIPINE BESYLATE 10 MG PO TABS: 10.0000 mg | ORAL_TABLET | Freq: Every day | ORAL | 1 refills | Status: DC

## 2021-05-16 MED ORDER — ATORVASTATIN CALCIUM 80 MG PO TABS: 80.0000 mg | ORAL_TABLET | Freq: Every day | ORAL | 1 refills | Status: DC

## 2021-05-16 MED ORDER — LISINOPRIL-HYDROCHLOROTHIAZIDE 20-12.5 MG PO TABS: 1.0000 | ORAL_TABLET | Freq: Every day | ORAL | 1 refills | Status: DC

## 2021-05-16 NOTE — Telephone Encounter (Signed)
Refills have been sent to the pt's pharmacy  

## 2021-05-16 NOTE — Telephone Encounter (Signed)
Patient needs his lisinopril, amlodipine, atorvastatin - it needs to be sent to [facility]. ?

## 2021-11-03 ENCOUNTER — Other Ambulatory Visit: Payer: Self-pay | Admitting: Internal Medicine

## 2021-11-03 DIAGNOSIS — E1169 Type 2 diabetes mellitus with other specified complication: Secondary | ICD-10-CM

## 2021-11-03 DIAGNOSIS — I10 Essential (primary) hypertension: Secondary | ICD-10-CM

## 2022-03-16 ENCOUNTER — Ambulatory Visit: Payer: Medicare (Managed Care)

## 2022-03-19 ENCOUNTER — Telehealth: Payer: Self-pay

## 2022-03-19 ENCOUNTER — Ambulatory Visit: Payer: Medicare (Managed Care)

## 2022-03-19 NOTE — Telephone Encounter (Signed)
Called patient lvm to return call, to complete AWV at [phone].  If no return call within 15 minutes, patient may reschedule for the next available appointment with [facility] or CMA. -S. Henritta Mutz,LPN 

## 2022-04-26 ENCOUNTER — Other Ambulatory Visit: Payer: Self-pay | Admitting: Family Medicine

## 2022-04-26 ENCOUNTER — Ambulatory Visit
Admission: RE | Admit: 2022-04-26 | Discharge: 2022-04-26 | Disposition: A | Discharge status: Home / Self Care | Payer: Medicare (Managed Care) | Source: Ambulatory Visit | Attending: Family Medicine | Admitting: Family Medicine

## 2022-04-26 DIAGNOSIS — M25551 Pain in right hip: Secondary | ICD-10-CM

## 2022-04-26 DIAGNOSIS — Z720 Tobacco use: Secondary | ICD-10-CM

## 2022-04-27 ENCOUNTER — Other Ambulatory Visit: Payer: Self-pay | Admitting: Family Medicine

## 2022-04-27 DIAGNOSIS — Z720 Tobacco use: Secondary | ICD-10-CM

## 2022-05-01 ENCOUNTER — Encounter: Payer: Self-pay | Admitting: Internal Medicine

## 2022-05-16 ENCOUNTER — Ambulatory Visit
Admission: RE | Admit: 2022-05-16 | Discharge: 2022-05-16 | Disposition: A | Discharge status: Home / Self Care | Payer: Medicare (Managed Care) | Source: Ambulatory Visit | Attending: Family Medicine | Admitting: Family Medicine

## 2022-05-16 DIAGNOSIS — Z720 Tobacco use: Secondary | ICD-10-CM

## 2022-05-16 DIAGNOSIS — Z87891 Personal history of nicotine dependence: Secondary | ICD-10-CM | POA: Diagnosis not present

## 2022-05-16 DIAGNOSIS — Z136 Encounter for screening for cardiovascular disorders: Secondary | ICD-10-CM | POA: Diagnosis not present

## 2022-05-23 ENCOUNTER — Other Ambulatory Visit: Payer: Self-pay | Admitting: Family Medicine

## 2022-05-23 DIAGNOSIS — Z720 Tobacco use: Secondary | ICD-10-CM

## 2022-06-25 ENCOUNTER — Other Ambulatory Visit: Payer: Medicare (Managed Care)

## 2022-07-06 ENCOUNTER — Ambulatory Visit (AMBULATORY_SURGERY_CENTER): Payer: Medicare (Managed Care)

## 2022-07-06 VITALS — Ht 66.0 in | Wt 204.0 lb

## 2022-07-06 DIAGNOSIS — Z86010 Personal history of colonic polyps: Secondary | ICD-10-CM

## 2022-07-06 MED ORDER — PEG 3350-KCL-NA BICARB-NACL 420 G PO SOLR
4000.0000 mL | Freq: Once | ORAL | 0 refills | Status: AC
Start: 2022-07-06 — End: 2022-07-06

## 2022-07-06 NOTE — Progress Notes (Signed)
No egg or soy allergy known to patient  No issues known to pt with past sedation with any surgeries or procedures Patient denies ever being told they had issues or difficulty with intubation  No FH of Malignant Hyperthermia Pt is not on diet pills Pt is not on  home 02  Pt is not on blood thinners  Pt denies issues with constipation  No A fib or A flutter Have any cardiac testing pending--no LOA: independent Prep: Golytely   Patient's chart reviewed by Cathlyn Parsons CNRA prior to previsit and patient appropriate for the [facility].  Previsit completed and red dot placed by patient's name on their procedure day (on provider's schedule).     PV competed with patient. Prep instructions sent via mychart and home address.

## 2022-07-20 ENCOUNTER — Encounter: Payer: Self-pay | Admitting: Internal Medicine

## 2022-07-31 ENCOUNTER — Ambulatory Visit (AMBULATORY_SURGERY_CENTER): Payer: Medicare (Managed Care) | Admitting: Internal Medicine

## 2022-07-31 ENCOUNTER — Encounter: Payer: Self-pay | Admitting: Family Medicine

## 2022-07-31 ENCOUNTER — Encounter: Payer: Self-pay | Admitting: Internal Medicine

## 2022-07-31 VITALS — BP 135/90 | HR 61 | Temp 97.8°F | Resp 15 | Ht 66.0 in | Wt 204.0 lb

## 2022-07-31 DIAGNOSIS — D123 Benign neoplasm of transverse colon: Secondary | ICD-10-CM | POA: Diagnosis not present

## 2022-07-31 DIAGNOSIS — D128 Benign neoplasm of rectum: Secondary | ICD-10-CM | POA: Diagnosis not present

## 2022-07-31 DIAGNOSIS — D122 Benign neoplasm of ascending colon: Secondary | ICD-10-CM

## 2022-07-31 DIAGNOSIS — Z09 Encounter for follow-up examination after completed treatment for conditions other than malignant neoplasm: Secondary | ICD-10-CM | POA: Diagnosis not present

## 2022-07-31 DIAGNOSIS — Z86010 Personal history of colonic polyps: Secondary | ICD-10-CM

## 2022-07-31 DIAGNOSIS — E785 Hyperlipidemia, unspecified: Secondary | ICD-10-CM | POA: Diagnosis not present

## 2022-07-31 DIAGNOSIS — I10 Essential (primary) hypertension: Secondary | ICD-10-CM | POA: Diagnosis not present

## 2022-07-31 DIAGNOSIS — K635 Polyp of colon: Secondary | ICD-10-CM | POA: Diagnosis not present

## 2022-07-31 MED ORDER — SODIUM CHLORIDE 0.9 % IV SOLN
500.0000 mL | Freq: Once | INTRAVENOUS | Status: DC
Start: 2022-07-31 — End: 2022-07-31

## 2022-07-31 NOTE — Op Note (Signed)
[facility] Patient Name: Tyler Klein Procedure Date: 07/31/2022 7:17 AM MRN: [id number] Endoscopist: Beverley Fiedler , MD, [id number] Age: 72 Referring MD:  Date of Birth: 01/20/1950 Gender: Male Account #: [id number] Procedure:                Colonoscopy Indications:              High risk colon cancer surveillance: Personal                            history of non-advanced adenoma, Last colonoscopy:                            December 2016 Medicines:                Monitored Anesthesia Care Procedure:                Pre-Anesthesia Assessment:                           - Prior to the procedure, a History and Physical                            was performed, and patient medications and                            allergies were reviewed. The patient's tolerance of                            previous anesthesia was also reviewed. The risks                            and benefits of the procedure and the sedation                            options and risks were discussed with the patient.                            All questions were answered, and informed consent                            was obtained. Prior Anticoagulants: The patient has                            taken no anticoagulant or antiplatelet agents. ASA                            Grade Assessment: II - A patient with mild systemic                            disease. After reviewing the risks and benefits,                            the patient was deemed in satisfactory condition to  undergo the procedure.                           After obtaining informed consent, the colonoscope                            was passed under direct vision. Throughout the                            procedure, the patient's blood pressure, pulse, and                            oxygen saturations were monitored continuously. The                            CF [id number] #[id number] was introduced through the anus                             and advanced to the cecum, identified by                            appendiceal orifice and ileocecal valve. The                            colonoscopy was performed without difficulty. The                            patient tolerated the procedure well. The quality                            of the bowel preparation was good. The ileocecal                            valve, appendiceal orifice, and rectum were                            photographed. Scope In: 8:05:47 AM Scope Out: 8:18:31 AM Scope Withdrawal Time: 0 hours 11 minutes 23 seconds  Total Procedure Duration: 0 hours 12 minutes 44 seconds  Findings:                 The digital rectal exam was normal.                           Two sessile polyps were found in the ascending                            colon. The polyps were 4 to 5 mm in size. These                            polyps were removed with a cold snare. Resection                            and retrieval were complete.  Two sessile polyps were found in the transverse                            colon. The polyps were 5 to 6 mm in size. These                            polyps were removed with a cold snare. Resection                            and retrieval were complete.                           An 8 mm polyp was found in the rectum. The polyp                            was sessile. The polyp was removed with a cold                            snare. Resection and retrieval were complete.                           Internal hemorrhoids were found during                            retroflexion. The hemorrhoids were medium-sized. Complications:            No immediate complications. Estimated Blood Loss:     Estimated blood loss was minimal. Impression:               - Two 4 to 5 mm polyps in the ascending colon,                            removed with a cold snare. Resected and retrieved.                           - Two  5 to 6 mm polyps in the transverse colon,                            removed with a cold snare. Resected and retrieved.                           - One 8 mm polyp in the rectum, removed with a cold                            snare. Resected and retrieved.                           - Internal hemorrhoids. Recommendation:           - Patient has a contact number available for                            emergencies. The signs and symptoms of potential  delayed complications were discussed with the                            patient. Return to normal activities tomorrow.                            Written discharge instructions were provided to the                            patient.                           - Resume previous diet.                           - Continue present medications.                           - Await pathology results.                           - Repeat colonoscopy is recommended for                            surveillance. The colonoscopy date will be                            determined after pathology results from today's                            exam become available for review. Beverley Fiedler, MD 07/31/2022 8:22:17 AM This report has been signed electronically.

## 2022-07-31 NOTE — Progress Notes (Signed)
Sedate, gd SR, tolerated procedure well, VSS, report to RN 

## 2022-07-31 NOTE — Progress Notes (Signed)
History reviewed today. No changes noted. VS assessed by D.T

## 2022-07-31 NOTE — Progress Notes (Signed)
Called to room to assist during endoscopic procedure.  Patient ID and intended procedure confirmed with present staff. Received instructions for my participation in the procedure from the performing physician.  

## 2022-07-31 NOTE — Patient Instructions (Signed)
   Handouts on polyps & hemorrhoids given to you today  Await pathology results on polyps removed    YOU HAD AN ENDOSCOPIC PROCEDURE TODAY AT THE [facility]:   Refer to the procedure report that was given to you for any specific questions about what was found during the examination.  If the procedure report does not answer your questions, please call your gastroenterologist to clarify.  If you requested that your care partner not be given the details of your procedure findings, then the procedure report has been included in a sealed envelope for you to review at your convenience later.  YOU SHOULD EXPECT: Some feelings of bloating in the abdomen. Passage of more gas than usual.  Walking can help get rid of the air that was put into your GI tract during the procedure and reduce the bloating. If you had a lower endoscopy (such as a colonoscopy or flexible sigmoidoscopy) you may notice spotting of blood in your stool or on the toilet paper. If you underwent a bowel prep for your procedure, you may not have a normal bowel movement for a few days.  Please Note:  You might notice some irritation and congestion in your nose or some drainage.  This is from the oxygen used during your procedure.  There is no need for concern and it should clear up in a day or so.  SYMPTOMS TO REPORT IMMEDIATELY:  Following lower endoscopy (colonoscopy or flexible sigmoidoscopy):  Excessive amounts of blood in the stool  Significant tenderness or worsening of abdominal pains  Swelling of the abdomen that is new, acute  Fever of 100F or higher   For urgent or emergent issues, a gastroenterologist can be reached at any hour by calling [phone]. Do not use MyChart messaging for urgent concerns.    DIET:  We do recommend a small meal at first, but then you may proceed to your regular diet.  Drink plenty of fluids but you should avoid alcoholic beverages for 24 hours.  ACTIVITY:  You should plan to  take it easy for the rest of today and you should NOT DRIVE or use heavy machinery until tomorrow (because of the sedation medicines used during the test).    FOLLOW UP: Our staff will call the number listed on your records the next business day following your procedure.  We will call around 7:15- 8:00 am to check on you and address any questions or concerns that you may have regarding the information given to you following your procedure. If we do not reach you, we will leave a message.     If any biopsies were taken you will be contacted by phone or by letter within the next 1-3 weeks.  Please call us at [phone] if you have not heard about the biopsies in 3 weeks.    SIGNATURES/CONFIDENTIALITY: You and/or your care partner have signed paperwork which will be entered into your electronic medical record.  These signatures attest to the fact that that the information above on your After Visit Summary has been reviewed and is understood.  Full responsibility of the confidentiality of this discharge information lies with you and/or your care-partner. 

## 2022-07-31 NOTE — Progress Notes (Signed)
GASTROENTEROLOGY PROCEDURE H&P NOTE   Primary Care Physician: Myrlene Broker, MD    Reason for Procedure:  History of adenomatous colon polyp  Plan:    Colonoscopy  Patient is appropriate for endoscopic procedure(s) in the ambulatory ([facility]) setting.  The nature of the procedure, as well as the risks, benefits, and alternatives were carefully and thoroughly reviewed with the patient. Ample time for discussion and questions allowed. The patient understood, was satisfied, and agreed to proceed.     HPI: Tyler Klein is a 72 y.o. male who presents for surveillance colonoscopy.  Medical history as below.  Tolerated the prep.  No recent chest pain or shortness of breath.  No abdominal pain today.  Past Medical History:  Diagnosis Date   Allergy    seasonal   HSV-1 (herpes simplex virus 1) infection    Hyperlipidemia    Hypertension     Past Surgical History:  Procedure Laterality Date   APPENDECTOMY     age [age]    COLONOSCOPY  12/15/2014   LUNG SURGERY  01/08/2009   left lung thoracotomy (01/2006)    Prior to Admission medications   Medication Sig Start Date End Date Taking? Authorizing Provider  amLODipine (NORVASC) 10 MG tablet TAKE 1 TABLET DAILY 11/03/21  Yes Myrlene Broker, MD  atorvastatin (LIPITOR) 80 MG tablet TAKE 1 TABLET DAILY 11/03/21  Yes Myrlene Broker, MD  lisinopril-hydrochlorothiazide (ZESTORETIC) 20-12.5 MG tablet TAKE 1 TABLET DAILY 11/03/21  Yes Myrlene Broker, MD  Naproxen Sodium (ALEVE PO) Take by mouth. As needed   Yes [provider]  Multiple Vitamin (MULTIVITAMIN) tablet Take 1 tablet by mouth once a week. To twice weekly Patient not taking: Reported on 07/06/2022    [provider]    Current Outpatient Medications  Medication Sig Dispense Refill   amLODipine (NORVASC) 10 MG tablet TAKE 1 TABLET DAILY 90 tablet 3   atorvastatin (LIPITOR) 80 MG tablet TAKE 1 TABLET DAILY 90 tablet 3    lisinopril-hydrochlorothiazide (ZESTORETIC) 20-12.5 MG tablet TAKE 1 TABLET DAILY 90 tablet 3   Naproxen Sodium (ALEVE PO) Take by mouth. As needed     Multiple Vitamin (MULTIVITAMIN) tablet Take 1 tablet by mouth once a week. To twice weekly (Patient not taking: Reported on 07/06/2022)     Current Facility-Administered Medications  Medication Dose Route Frequency Provider Last Rate Last Admin   0.9 %  sodium chloride infusion  500 mL Intravenous Once Sherryn Pollino, Carie Caddy, MD        Allergies as of 07/31/2022   (No Known Allergies)    Family History  Problem Relation Age of Onset   Hypertension Son    Hypertension Sister    Hypertension Mother    Colon cancer Neg Hx    Colon polyps Neg Hx    Esophageal cancer Neg Hx    Prostate cancer Neg Hx    Rectal cancer Neg Hx    Stomach cancer Neg Hx     Social History   Socioeconomic History   Marital status: Married    Spouse name: Not on file   Number of children: Not on file   Years of education: Not on file   Highest education level: Not on file  Occupational History   Not on file  Tobacco Use   Smoking status: Some Days    Current packs/day: 0.50    Types: Cigarettes   Smokeless tobacco: Never  Vaping Use   Vaping status: Never Used  Substance and Sexual Activity   Alcohol use: No    Alcohol/week: 0.0 standard drinks of alcohol   Drug use: No   Sexual activity: Not on file  Other Topics Concern   Not on file  Social History Narrative   Not on file   Social Determinants of Health   Financial Resource Strain: Low Risk  (03/14/2021)   Overall Financial Resource Strain (CARDIA)    Difficulty of Paying Living Expenses: Not hard at all  Food Insecurity: No Food Insecurity (03/14/2021)   Hunger Vital Sign    Worried About Running Out of Food in the Last Year: Never true    Ran Out of Food in the Last Year: Never true  Transportation Needs: No Transportation Needs (03/14/2021)   PRAPARE - Administrator, Civil Service  (Medical): No    Lack of Transportation (Non-Medical): No  Physical Activity: Sufficiently Active (03/14/2021)   Exercise Vital Sign    Days of Exercise per Week: 5 days    Minutes of Exercise per Session: 30 min  Stress: No Stress Concern Present (03/14/2021)   [employer] of Occupational Health - Occupational Stress Questionnaire    Feeling of Stress : Not at all  Social Connections: Socially Integrated (03/14/2021)   Social Connection and Isolation Panel [NHANES]    Frequency of Communication with Friends and Family: More than three times a week    Frequency of Social Gatherings with Friends and Family: More than three times a week    Attends Religious Services: 1 to 4 times per year    Active Member of [employer] or Organizations: No    Attends Banker Meetings: 1 to 4 times per year    Marital Status: Married  Catering manager Violence: Not At Risk (03/14/2021)   Humiliation, Afraid, Rape, and Kick questionnaire    Fear of Current or Ex-Partner: No    Emotionally Abused: No    Physically Abused: No    Sexually Abused: No    Physical Exam: Vital signs in last 24 hours: @BP  (!) 148/84   Pulse 63   Temp 97.8 F (36.6 C) (Skin)   Resp (!) 5   Ht 5\' 6"  (1.676 m)   Wt 204 lb (92.5 kg)   SpO2 100%   BMI 32.93 kg/m  GEN: NAD EYE: Sclerae anicteric ENT: MMM CV: Non-tachycardic Pulm: CTA b/l GI: Soft, NT/ND NEURO:  Alert & Oriented x 3   Erick Blinks, MD [facility]  07/31/2022 8:00 AM

## 2022-08-01 ENCOUNTER — Telehealth: Payer: Self-pay

## 2022-08-01 ENCOUNTER — Inpatient Hospital Stay: Admission: RE | Admit: 2022-08-01 | Payer: Medicare (Managed Care) | Source: Ambulatory Visit

## 2022-08-01 DIAGNOSIS — Z720 Tobacco use: Secondary | ICD-10-CM

## 2022-08-01 DIAGNOSIS — F17210 Nicotine dependence, cigarettes, uncomplicated: Secondary | ICD-10-CM | POA: Diagnosis not present

## 2022-08-01 NOTE — Telephone Encounter (Signed)
Follow up call to pt, lm for pt to call if having any difficulty with normal activities or eating and drinking.  Also to call if any other questions or concerns.  

## 2022-08-03 ENCOUNTER — Encounter: Payer: Self-pay | Admitting: Internal Medicine

## 2022-09-21 DIAGNOSIS — F17210 Nicotine dependence, cigarettes, uncomplicated: Secondary | ICD-10-CM | POA: Diagnosis not present

## 2022-09-21 DIAGNOSIS — Z0000 Encounter for general adult medical examination without abnormal findings: Secondary | ICD-10-CM | POA: Diagnosis not present

## 2022-09-21 DIAGNOSIS — I700 Atherosclerosis of aorta: Secondary | ICD-10-CM | POA: Diagnosis not present

## 2022-09-21 DIAGNOSIS — J439 Emphysema, unspecified: Secondary | ICD-10-CM | POA: Diagnosis not present

## 2022-09-21 DIAGNOSIS — Z23 Encounter for immunization: Secondary | ICD-10-CM | POA: Diagnosis not present

## 2022-09-21 DIAGNOSIS — I10 Essential (primary) hypertension: Secondary | ICD-10-CM | POA: Diagnosis not present

## 2022-10-08 ENCOUNTER — Telehealth: Payer: Self-pay | Admitting: Internal Medicine

## 2022-10-08 NOTE — Telephone Encounter (Signed)
Contacted Tyler Klein to schedule their annual wellness visit. Patient declined to schedule AWV at this time. Transferred care.  [facility] Care Guide [facility] AWV TEAM Direct Dial: [phone]

## 2022-10-22 ENCOUNTER — Other Ambulatory Visit: Payer: Self-pay | Admitting: Internal Medicine

## 2022-10-22 DIAGNOSIS — E1169 Type 2 diabetes mellitus with other specified complication: Secondary | ICD-10-CM

## 2022-10-22 DIAGNOSIS — I10 Essential (primary) hypertension: Secondary | ICD-10-CM

## 2022-10-24 DIAGNOSIS — Z114 Encounter for screening for human immunodeficiency virus [HIV]: Secondary | ICD-10-CM | POA: Diagnosis not present

## 2022-10-24 DIAGNOSIS — J439 Emphysema, unspecified: Secondary | ICD-10-CM | POA: Diagnosis not present

## 2022-10-24 DIAGNOSIS — Z1159 Encounter for screening for other viral diseases: Secondary | ICD-10-CM | POA: Diagnosis not present

## 2022-10-24 DIAGNOSIS — E559 Vitamin D deficiency, unspecified: Secondary | ICD-10-CM | POA: Diagnosis not present

## 2022-10-24 DIAGNOSIS — Z79899 Other long term (current) drug therapy: Secondary | ICD-10-CM | POA: Diagnosis not present

## 2023-04-12 DIAGNOSIS — Z139 Encounter for screening, unspecified: Secondary | ICD-10-CM | POA: Diagnosis not present

## 2023-04-12 DIAGNOSIS — I700 Atherosclerosis of aorta: Secondary | ICD-10-CM | POA: Diagnosis not present

## 2023-04-12 DIAGNOSIS — E1122 Type 2 diabetes mellitus with diabetic chronic kidney disease: Secondary | ICD-10-CM | POA: Diagnosis not present

## 2023-04-12 DIAGNOSIS — J439 Emphysema, unspecified: Secondary | ICD-10-CM | POA: Diagnosis not present

## 2023-04-12 DIAGNOSIS — E559 Vitamin D deficiency, unspecified: Secondary | ICD-10-CM | POA: Diagnosis not present

## 2023-04-12 DIAGNOSIS — E669 Obesity, unspecified: Secondary | ICD-10-CM | POA: Diagnosis not present

## 2023-04-12 DIAGNOSIS — N1832 Chronic kidney disease, stage 3b: Secondary | ICD-10-CM | POA: Diagnosis not present

## 2023-04-12 DIAGNOSIS — Z1389 Encounter for screening for other disorder: Secondary | ICD-10-CM | POA: Diagnosis not present

## 2023-04-12 DIAGNOSIS — Z0000 Encounter for general adult medical examination without abnormal findings: Secondary | ICD-10-CM | POA: Diagnosis not present

## 2023-04-12 DIAGNOSIS — Z79899 Other long term (current) drug therapy: Secondary | ICD-10-CM | POA: Diagnosis not present

## 2023-04-12 DIAGNOSIS — I422 Other hypertrophic cardiomyopathy: Secondary | ICD-10-CM | POA: Diagnosis not present

## 2023-04-12 DIAGNOSIS — I509 Heart failure, unspecified: Secondary | ICD-10-CM | POA: Diagnosis not present

## 2023-06-05 DIAGNOSIS — Z79899 Other long term (current) drug therapy: Secondary | ICD-10-CM | POA: Diagnosis not present

## 2023-07-08 DIAGNOSIS — G4730 Sleep apnea, unspecified: Secondary | ICD-10-CM | POA: Diagnosis not present

## 2023-07-08 DIAGNOSIS — E669 Obesity, unspecified: Secondary | ICD-10-CM | POA: Diagnosis not present

## 2023-07-08 DIAGNOSIS — Z139 Encounter for screening, unspecified: Secondary | ICD-10-CM | POA: Diagnosis not present

## 2023-07-08 DIAGNOSIS — N1832 Chronic kidney disease, stage 3b: Secondary | ICD-10-CM | POA: Diagnosis not present

## 2023-07-08 DIAGNOSIS — Z7189 Other specified counseling: Secondary | ICD-10-CM | POA: Diagnosis not present

## 2023-11-05 ENCOUNTER — Other Ambulatory Visit: Payer: Self-pay | Admitting: Nephrology

## 2023-11-05 DIAGNOSIS — I129 Hypertensive chronic kidney disease with stage 1 through stage 4 chronic kidney disease, or unspecified chronic kidney disease: Secondary | ICD-10-CM | POA: Diagnosis not present

## 2023-11-05 DIAGNOSIS — N1832 Chronic kidney disease, stage 3b: Secondary | ICD-10-CM

## 2023-11-05 DIAGNOSIS — N2581 Secondary hyperparathyroidism of renal origin: Secondary | ICD-10-CM | POA: Diagnosis not present

## 2023-11-05 DIAGNOSIS — D631 Anemia in chronic kidney disease: Secondary | ICD-10-CM | POA: Diagnosis not present

## 2023-11-06 DIAGNOSIS — N1832 Chronic kidney disease, stage 3b: Secondary | ICD-10-CM | POA: Diagnosis not present

## 2023-11-14 ENCOUNTER — Ambulatory Visit
Admission: RE | Admit: 2023-11-14 | Discharge: 2023-11-14 | Disposition: A | Payer: Medicare (Managed Care) | Source: Ambulatory Visit | Attending: Nephrology | Admitting: Nephrology

## 2023-11-14 DIAGNOSIS — N281 Cyst of kidney, acquired: Secondary | ICD-10-CM | POA: Diagnosis not present

## 2023-11-14 DIAGNOSIS — N1832 Chronic kidney disease, stage 3b: Secondary | ICD-10-CM

## 2023-12-10 ENCOUNTER — Other Ambulatory Visit: Payer: Self-pay | Admitting: Nephrology

## 2023-12-10 DIAGNOSIS — Q6102 Congenital multiple renal cysts: Secondary | ICD-10-CM

## 2023-12-25 ENCOUNTER — Encounter: Payer: Self-pay | Admitting: Nephrology

## 2024-01-08 ENCOUNTER — Ambulatory Visit
Admission: RE | Admit: 2024-01-08 | Discharge: 2024-01-08 | Disposition: A | Discharge status: Home / Self Care | Payer: Medicare (Managed Care) | Source: Ambulatory Visit | Attending: Nephrology | Admitting: Nephrology

## 2024-01-08 DIAGNOSIS — Q6102 Congenital multiple renal cysts: Secondary | ICD-10-CM
